# Patient Record
Sex: Male | Born: 1955
Health system: Southern US, Community
[De-identification: ages and names within clinical notes are randomized; demographics above are authoritative.]

## PROBLEM LIST (undated history)

## (undated) DIAGNOSIS — F32A Depression, unspecified: Secondary | ICD-10-CM

## (undated) DIAGNOSIS — F329 Major depressive disorder, single episode, unspecified: Secondary | ICD-10-CM

## (undated) DIAGNOSIS — E119 Type 2 diabetes mellitus without complications: Secondary | ICD-10-CM

## (undated) DIAGNOSIS — B182 Chronic viral hepatitis C: Secondary | ICD-10-CM

## (undated) DIAGNOSIS — E1142 Type 2 diabetes mellitus with diabetic polyneuropathy: Secondary | ICD-10-CM

## (undated) HISTORY — DX: Type 2 diabetes mellitus with diabetic polyneuropathy: E11.42

## (undated) HISTORY — DX: Depression, unspecified: F32.A

## (undated) HISTORY — DX: Major depressive disorder, single episode, unspecified: F32.9

## (undated) HISTORY — DX: Chronic viral hepatitis C: B18.2

---

## 2002-07-06 ENCOUNTER — Emergency Department (HOSPITAL_COMMUNITY): Admission: EM | Admit: 2002-07-06 | Discharge: 2002-07-06 | Payer: Self-pay

## 2005-01-12 ENCOUNTER — Emergency Department (HOSPITAL_COMMUNITY): Admission: EM | Admit: 2005-01-12 | Discharge: 2005-01-13 | Payer: Self-pay | Admitting: Family Medicine

## 2007-01-21 ENCOUNTER — Emergency Department (HOSPITAL_COMMUNITY): Admission: EM | Admit: 2007-01-21 | Discharge: 2007-01-21 | Payer: Self-pay | Admitting: Emergency Medicine

## 2007-05-27 ENCOUNTER — Emergency Department (HOSPITAL_COMMUNITY): Admission: EM | Admit: 2007-05-27 | Discharge: 2007-05-27 | Payer: Self-pay | Admitting: Emergency Medicine

## 2007-05-29 ENCOUNTER — Emergency Department (HOSPITAL_COMMUNITY): Admission: EM | Admit: 2007-05-29 | Discharge: 2007-05-29 | Payer: Self-pay | Admitting: Emergency Medicine

## 2007-07-03 ENCOUNTER — Emergency Department (HOSPITAL_COMMUNITY): Admission: EM | Admit: 2007-07-03 | Discharge: 2007-07-03 | Payer: Self-pay | Admitting: Emergency Medicine

## 2007-08-14 ENCOUNTER — Emergency Department (HOSPITAL_COMMUNITY): Admission: EM | Admit: 2007-08-14 | Discharge: 2007-08-14 | Payer: Self-pay | Admitting: Emergency Medicine

## 2007-08-22 ENCOUNTER — Emergency Department (HOSPITAL_COMMUNITY): Admission: EM | Admit: 2007-08-22 | Discharge: 2007-08-22 | Payer: Self-pay | Admitting: Emergency Medicine

## 2008-01-04 ENCOUNTER — Ambulatory Visit: Payer: Self-pay | Admitting: Internal Medicine

## 2008-01-04 ENCOUNTER — Ambulatory Visit: Payer: Self-pay | Admitting: *Deleted

## 2008-01-04 LAB — CONVERTED CEMR LAB: Microalb, Ur: 0.71 mg/dL (ref 0.00–1.89)

## 2008-01-13 ENCOUNTER — Emergency Department (HOSPITAL_COMMUNITY): Admission: EM | Admit: 2008-01-13 | Discharge: 2008-01-13 | Payer: Self-pay | Admitting: Family Medicine

## 2008-01-20 ENCOUNTER — Ambulatory Visit (HOSPITAL_COMMUNITY): Admission: RE | Admit: 2008-01-20 | Discharge: 2008-01-20 | Payer: Self-pay | Admitting: Internal Medicine

## 2008-01-20 ENCOUNTER — Emergency Department (HOSPITAL_COMMUNITY): Admission: EM | Admit: 2008-01-20 | Discharge: 2008-01-20 | Payer: Self-pay | Admitting: Family Medicine

## 2008-01-30 ENCOUNTER — Ambulatory Visit: Payer: Self-pay | Admitting: Internal Medicine

## 2008-03-06 ENCOUNTER — Ambulatory Visit: Payer: Self-pay | Admitting: *Deleted

## 2008-03-06 ENCOUNTER — Emergency Department (HOSPITAL_COMMUNITY): Admission: EM | Admit: 2008-03-06 | Discharge: 2008-03-07 | Payer: Self-pay | Admitting: Emergency Medicine

## 2008-03-07 ENCOUNTER — Inpatient Hospital Stay (HOSPITAL_COMMUNITY): Admission: RE | Admit: 2008-03-07 | Discharge: 2008-03-14 | Payer: Self-pay | Admitting: *Deleted

## 2008-03-17 ENCOUNTER — Emergency Department (HOSPITAL_COMMUNITY): Admission: EM | Admit: 2008-03-17 | Discharge: 2008-03-17 | Payer: Self-pay | Admitting: Emergency Medicine

## 2008-03-20 ENCOUNTER — Emergency Department (HOSPITAL_COMMUNITY): Admission: EM | Admit: 2008-03-20 | Discharge: 2008-03-20 | Payer: Self-pay | Admitting: Emergency Medicine

## 2008-04-02 ENCOUNTER — Emergency Department (HOSPITAL_COMMUNITY): Admission: EM | Admit: 2008-04-02 | Discharge: 2008-04-02 | Payer: Self-pay | Admitting: Emergency Medicine

## 2008-04-15 ENCOUNTER — Emergency Department (HOSPITAL_COMMUNITY): Admission: EM | Admit: 2008-04-15 | Discharge: 2008-04-15 | Payer: Self-pay | Admitting: Emergency Medicine

## 2008-07-30 ENCOUNTER — Emergency Department (HOSPITAL_COMMUNITY): Admission: EM | Admit: 2008-07-30 | Discharge: 2008-07-30 | Payer: Self-pay | Admitting: Emergency Medicine

## 2009-06-04 ENCOUNTER — Emergency Department (HOSPITAL_COMMUNITY): Admission: EM | Admit: 2009-06-04 | Discharge: 2009-06-05 | Payer: Self-pay | Admitting: Emergency Medicine

## 2009-09-30 IMAGING — CR DG HIP COMPLETE 2+V*R*
3 series · 3 of 3 positions shown · non-contrast
Comparison: 01/20/2008

CLINICAL DATA: Bilateral hip pain right greater left.  Right leg
pain.  The patient fell

RIGHT HIP - COMPLETE 2+ VIEW

[t pelvis a.p.]
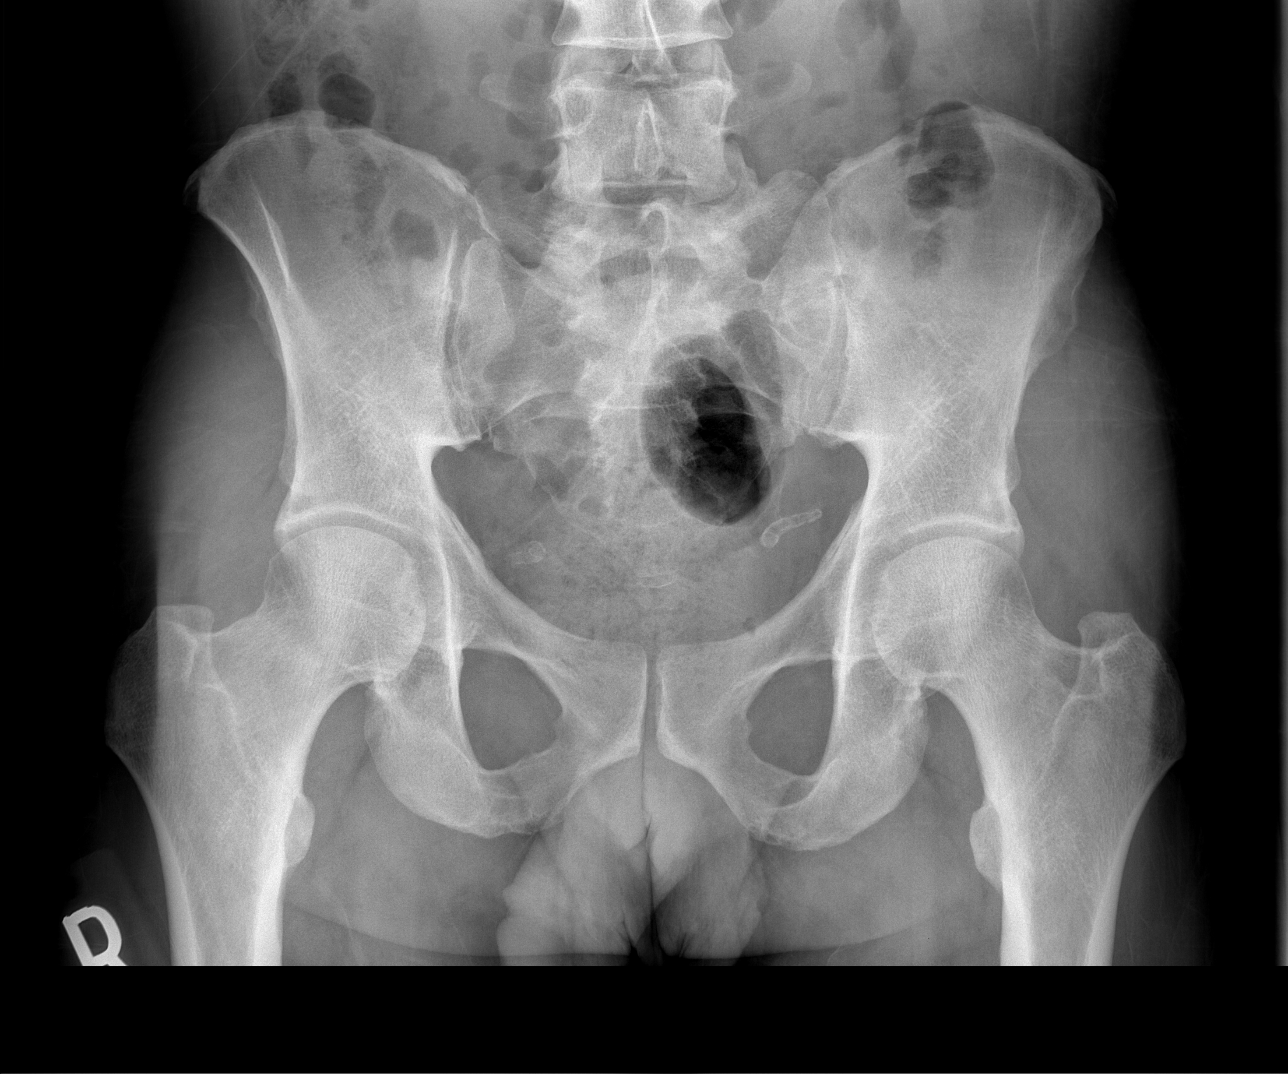

[t hip ap right]
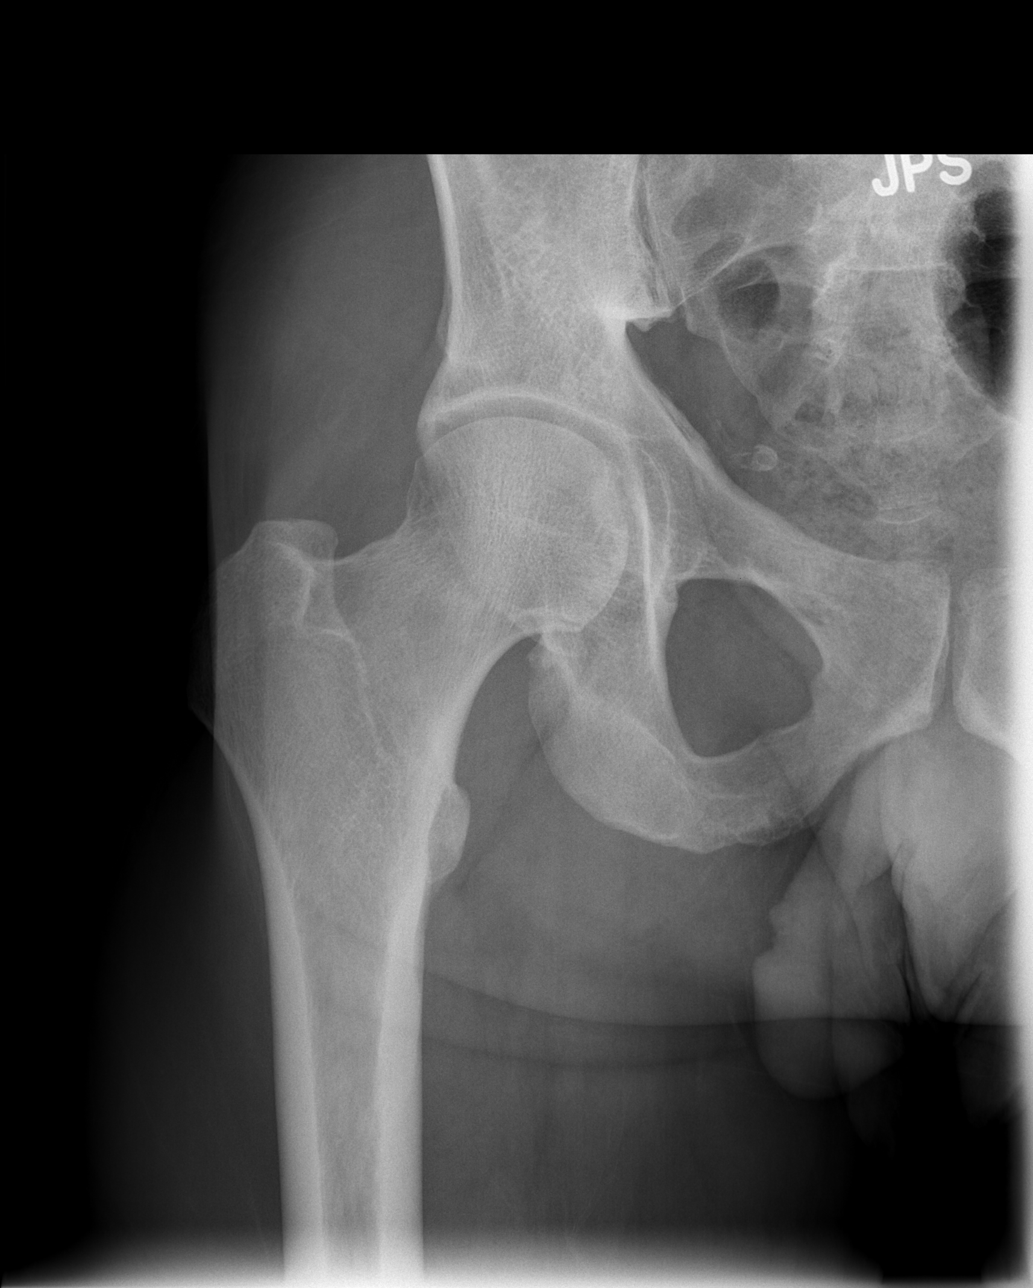

[t hip frog leg right]
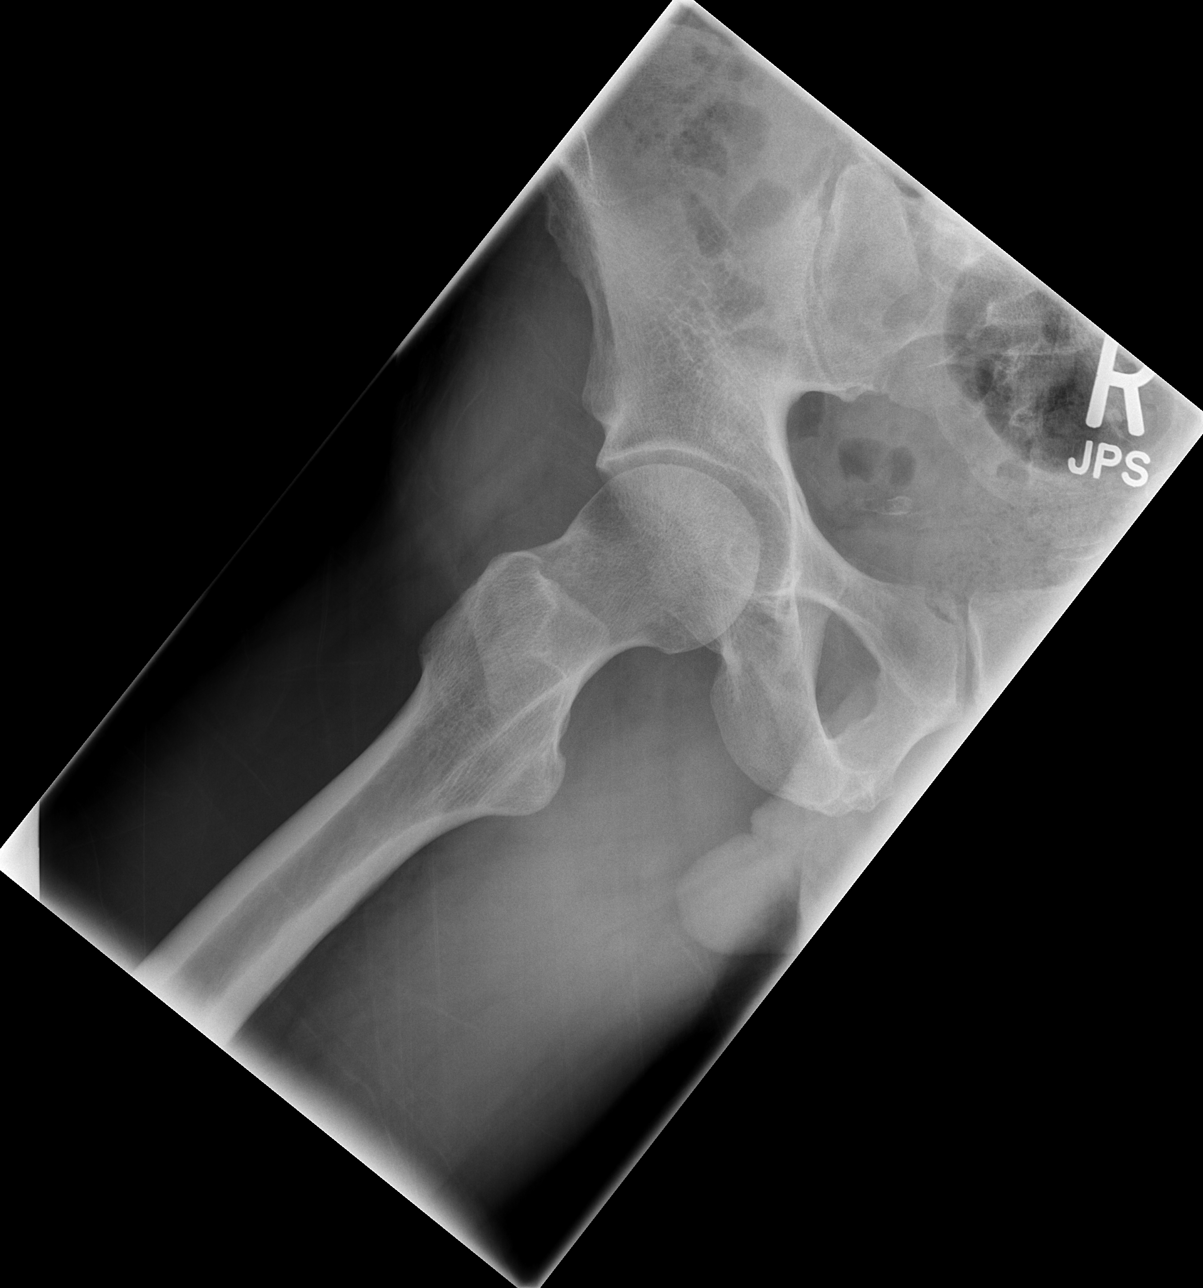

[3 of 3 positions shown; findings below may reference images not displayed]

FINDINGS: Calcified vas deferens compatible with diabetes mellitus.
The hip and sacroiliac articulations appear unremarkable.  No joint
space narrowing.  No osteophytic formation.  No lytic or sclerotic
lesions.  No fracture. Incidental note is made of a transitional
lumbosacral vertebrae with a prominent left transverse process
articulating with the sacral ala.
IMPRESSION: Negative right hip.  Calcified vas deferens consistent with
diabetes mellitus

## 2010-10-20 LAB — URINALYSIS, ROUTINE W REFLEX MICROSCOPIC
Bilirubin Urine: NEGATIVE
Glucose, UA: NEGATIVE mg/dL
Nitrite: NEGATIVE
Protein, ur: NEGATIVE mg/dL
Urobilinogen, UA: 1 mg/dL (ref 0.0–1.0)
pH: 6 (ref 5.0–8.0)

## 2010-10-20 LAB — BASIC METABOLIC PANEL
Creatinine, Ser: 1.06 mg/dL (ref 0.4–1.5)
GFR calc Af Amer: >60 mL/min (ref >60)
GFR calc non Af Amer: >60 mL/min (ref >60)
Glucose, Bld: 143 mg/dL — ABNORMAL HIGH (ref 70–99)
Potassium: 3.8 mEq/L (ref 3.5–5.1)
Sodium: 138 mEq/L (ref 135–145)

## 2010-10-20 LAB — RAPID URINE DRUG SCREEN, HOSP PERFORMED
Amphetamines: NOT DETECTED
Benzodiazepines: NOT DETECTED
Opiates: POSITIVE — AB

## 2010-10-20 LAB — CBC
HCT: 38.8 % — ABNORMAL LOW (ref 39.0–52.0)
MCHC: 34.6 g/dL (ref 30.0–36.0)
MCV: 99.7 fL (ref 78.0–100.0)
RBC: 3.89 MIL/uL — ABNORMAL LOW (ref 4.22–5.81)

## 2010-10-20 LAB — DIFFERENTIAL
Basophils Relative: 1 % (ref 0–1)
Eosinophils Absolute: 0.1 10*3/uL (ref 0.0–0.7)
Monocytes Relative: 9 % (ref 3–12)
Neutro Abs: 2.8 10*3/uL (ref 1.7–7.7)

## 2010-11-01 LAB — RPR: RPR Ser Ql: NONREACTIVE

## 2010-11-30 NOTE — Discharge Summary (Signed)
William Ware, William Ware NO.:  [id number]   MEDICAL RECORD NO.:  [id number]          PATIENT TYPE:  IPS   LOCATION:  0503                          FACILITY:  [facility]   PHYSICIAN:  Geoffery Lyons, M.D.      DATE OF BIRTH:  Mar 21, 1956   DATE OF ADMISSION:  03/07/2008  DATE OF DISCHARGE:  03/14/2008                               DISCHARGE SUMMARY   CHIEF COMPLAINT/HISTORY OF PRESENT ILLNESS:  This was the first  admission to [facility] for this 55 year old African  American male, voluntarily admitted.  Initially presented in the ED  complaining of myalgia and muscle spasms 2 days after he stopped using  heroin.  He reported a history of multiple substances.  Reported a  history of dependency to several substances.  Was in treatment many  years ago in [facility] in [place],  more recently  using heroin for the past year, 2 or 3 bags daily for the past 12  months.  Last used August __________ , 2009.  Drinking about a 6-pack  daily for the past year, cocaine on and off on an irregular basis,  smoking marijuana since age 65.  No clear period of complete abstinence.  One of the stressors is unstable housing.  Although his family is  supportive, he moves from home to home.   PAST PSYCHIATRIC HISTORY:  First time at [facility].   ALCOHOL AND DRUG HISTORY:  As already stated, persistent use of opiates  as of late.  He was in treatment previously at [facility].  No  significant sobriety.   MEDICAL HISTORY:  Aches, pains.  No chronic illnesses.   MEDICATIONS:  None.   PHYSICAL EXAMINATION:  Failed to show any acute findings.   LABORATORY WORKUP:  CBC:  White blood cells 4.3, hemoglobin 13.3, sodium  139, potassium 3.6, BUN 9, creatinine 1.1, glucose 92.  UDS positive for  opiates, cocaine and marijuana.   MENTAL STATUS EXAM:  Reveals a fully alert, cooperative male.  Mood  anxious, depressed.  Affect constricted.  Thought  processes logical,  coherent and relevant.  Not spontaneous.  Denies any active suicidal or  homicidal ideas.  Wants to be substance-free.  No delusions.  No  hallucinations.  Cognition well-preserved.   ADMITTING DIAGNOSES:  AXIS I:  Alcohol, opiate, marijuana, cocaine  abuse.  Rule out dependence.  AXIS II:  No diagnosis.  AXIS III:  No diagnosis.  AXIS IV:  Moderate.  AXIS V:  On admission 35, highest GAF in the last year 60.   COURSE IN THE HOSPITAL:  He was admitted.  He was detoxified with  Librium as well as clonidine.  He was helped with some Robaxin and some  Neurontin.  He was also given some Seroquel.  As already stated, a 55-  year-old male, endorsed a long history of addictions.  Says he cannot  deal with the addiction anymore.  Admits to heroin IV, also cocaine,  marijuana as of recently.  No significant sobriety.  Has lasted 2 days.  When the withdrawal hits him, he gets  out and hustles and gets his fix,  gets high and goes home.  This has been his lifestyle.  Claims he only  has a possession charge but says they do not have any evidence and they  are probably going to throw it out.  Wants to detox, wants to get into a  long-term treatment facility.  Endorsed no income, no real support.  Overwhelmed.  Feeling weak.  Cannot get up.  Aches.  GI distress.  Craving mostly heroin.  He continued to be mostly in bed, having a hard  time with the withdrawal.  Endorsed that he needed more help, wanting to  be considered to go to a residential program.  He was actually pretty  unsteady when walking but did not want to use the wheelchair or the  walker.  On August 25, mostly somatically focused pain, aches, a hard  time anticipating what would happen once he was out of the hospital.  Concerned that he would go back to the same situation as he had on  methadone or Suboxone.  August 26 continued to detox.  Malaise, aches,  pains, anxiety, restlessness, decreased sleep.  We tried  Seroquel 100  every 6 hours as needed.  We continued to address the withdrawal.  August 27 endorsed that the Seroquel was helping with the anxiety, still  worried about where to go.  We continued to work towards identifying  placement options.  He was going to be able to go to [facility] the following  week, so he felt that he could go and stay with his sister and be there  waiting for the bed as he felt that he would be better off at this  particular time being out of the hospital.   DISCHARGE DIAGNOSES:  AXIS I:  Opiate dependence.  Alcohol, marijuana,  cocaine abuse.  Opiate withdrawal, resolved.  Depressive disorder not  otherwise specified.  Anxiety disorder not otherwise specified.  AXIS II:  No diagnosis.  AXIS III:  No diagnosis.  AXIS IV:  Moderate.  AXIS V:  On discharge 50 to 55.   Discharged on Neurontin 100 two 4 times a day and Seroquel 100 one 3  times a day.  Followup by [facility] in [place], [place].      Geoffery Lyons, M.D.  Electronically Signed     IL/MEDQ  D:  03/27/2008  T:  03/28/2008  Job:  [id number]

## 2010-11-30 NOTE — H&P (Signed)
William Ware, TUCKEY               ACCOUNT NO.:  [id number]   MEDICAL RECORD NO.:  [id number]          PATIENT TYPE:  IPS   LOCATION:  0503                          FACILITY:  [facility]   PHYSICIAN:  Geoffery Lyons, M.D.      DATE OF BIRTH:  1955-12-15   DATE OF ADMISSION:  03/07/2008  DATE OF DISCHARGE:                       PSYCHIATRIC ADMISSION ASSESSMENT   TIME:  1340.   IDENTIFICATION:  This is a 55 year old African American male.  This is a  voluntary admission.   HISTORY OF PRESENT ILLNESS:  First [facility] admission for this 55 year old  who initially presented in the emergency room complaining of myalgia and  muscle spasms 2 days after he stopped using heroin.  He reports a  history of polysubstance abuse, with his last treatment many years ago  at the [facility] in [place].  Most recently he has  been using heroin for about the past year, 2 or 3 bags daily for the  past 12 months and last used on March 15, 2008 about $100 worth.  Alcohol since the age of 29, drinking about a six-pack daily for the  past year or so.  Cocaine on and off on a regular basis and smoking  marijuana since age 36, smoking about several joints a day.  His last  drug use was also on March 05, 2008.  No clear period of complete  abstinence.  Says that one of his stressors is unstable housing.  Although his family is supportive, he moves from home to home.  Does not  have much stability.  He denies any history of suicide attempts.  Denies  suicidal thoughts.   PAST PSYCHIATRIC HISTORY:  First [facility] admission.  He does have a history  of substance abuse as noted above with prior treatment at [facility] in [place] many years ago   SOCIAL HISTORY:  A single African American male.  He is unemployed and  is currently homeless.  No known legal charges.   FAMILY HISTORY:  He denies a family history of substance abuse or mental  illness.   MEDICAL HISTORY:  He is followed at  [facility].  In the past has seen  Dr. Reche Dixon for various aches and pains.  He denies any chronic illness.   PAST MEDICAL HISTORY:  Significant for a recent skin rash on his hands  that was treated with Bactroban.   CURRENT MEDICATIONS:  None.   DRUG ALLERGIES:  None.   PHYSICAL EXAM:  Was done in the emergency room as noted in the record.  This is a healthy-appearing African American male.  He is 5 feet 9  inches tall, 155 pounds, temperature 97.5, pulse 63, respirations 18,  blood pressure 154/40.   DIAGNOSTIC STUDIES:  CBC:  WBC 4.3.  Hemoglobin 13.3, hematocrit 39,  platelets 285,000 and MCV 95.4.  Chemistry:  Sodium 139, potassium 3.6,  chloride 105, carbon dioxide is 29, BUN 9, creatinine 1.1 and random  glucose 92.  Alcohol level less than 5.  Urine drug screen was positive  for opiates, cocaine and marijuana.  His urinalysis was  within normal  limits.   MENTAL STATUS EXAM:  A fully alert male, pleasant, cooperative, rather  constricted affect, oriented x4, appropriate, polite.  Speech soft in  tone, normal production.  Mood is neutral.  Thought process logical,  coherent and goal-directed.  Wants to be free from substances; wanting  help with his substance abuse problem.  Open to counseling and for help  with follow-up arrangements.  Insight good.  Impulse control and  judgment within normal limits.  No suicidal thoughts or dangerous ideas.  Immediate recent and remote memory is intact.   DIAGNOSES:  AXIS I:  Alcohol abuse, rule out dependence.  Opiate abuse,  rule out dependence, polysubstance abuse.  AXIS II:  Deferred.  AXIS III:  No diagnosis.  AXIS IV:  Severe issues with homelessness, but having supportive family  is an asset to him.  AXIS V:  Current 46, past year not known.   PLAN:  Admit him with a goal of a safe detox in 5 days.  We started him  on both a clonidine withdrawal protocol to manage his withdrawal from  opiates and a Librium withdrawal protocol  to manage his withdrawal from  alcohol.  He is tolerating them well, is actively participating in our  dual diagnosis program and will follow from there.  He has prescribed  additional medications for comfort and thiamine 100 mg daily along with  multivitamins and trazodone 100 mg at night h.s. p.r.n. insomnia.      Margaret A. Scott, N.P.      Geoffery Lyons, M.D.  Electronically Signed    MAS/MEDQ  D:  03/07/2008  T:  03/07/2008  Job:  [id number]

## 2010-12-02 IMAGING — CR DG CHEST 2V
2 series · 2 of 2 positions shown · non-contrast
Comparison: None

CLINICAL DATA: Medical clearance for alcohol detoxification.

CHEST - 2 VIEW

[w chest pa]
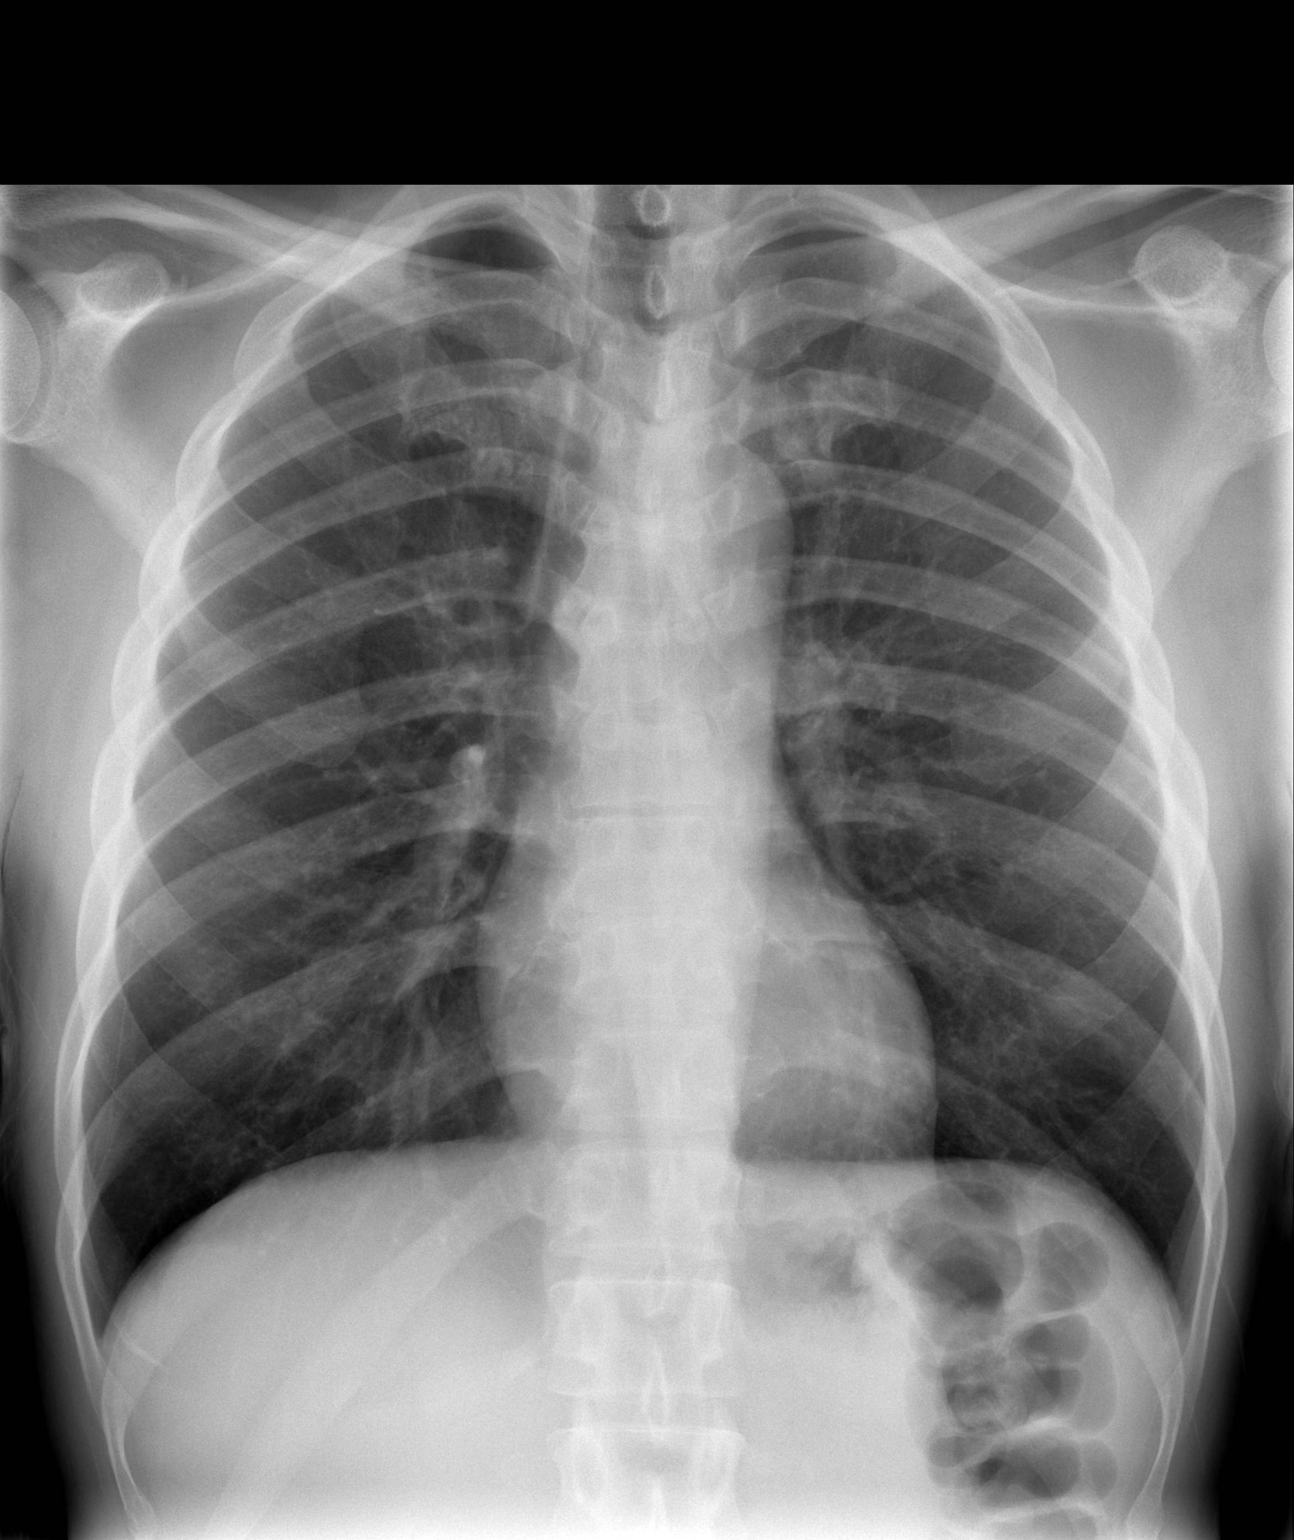

[w chest lat]
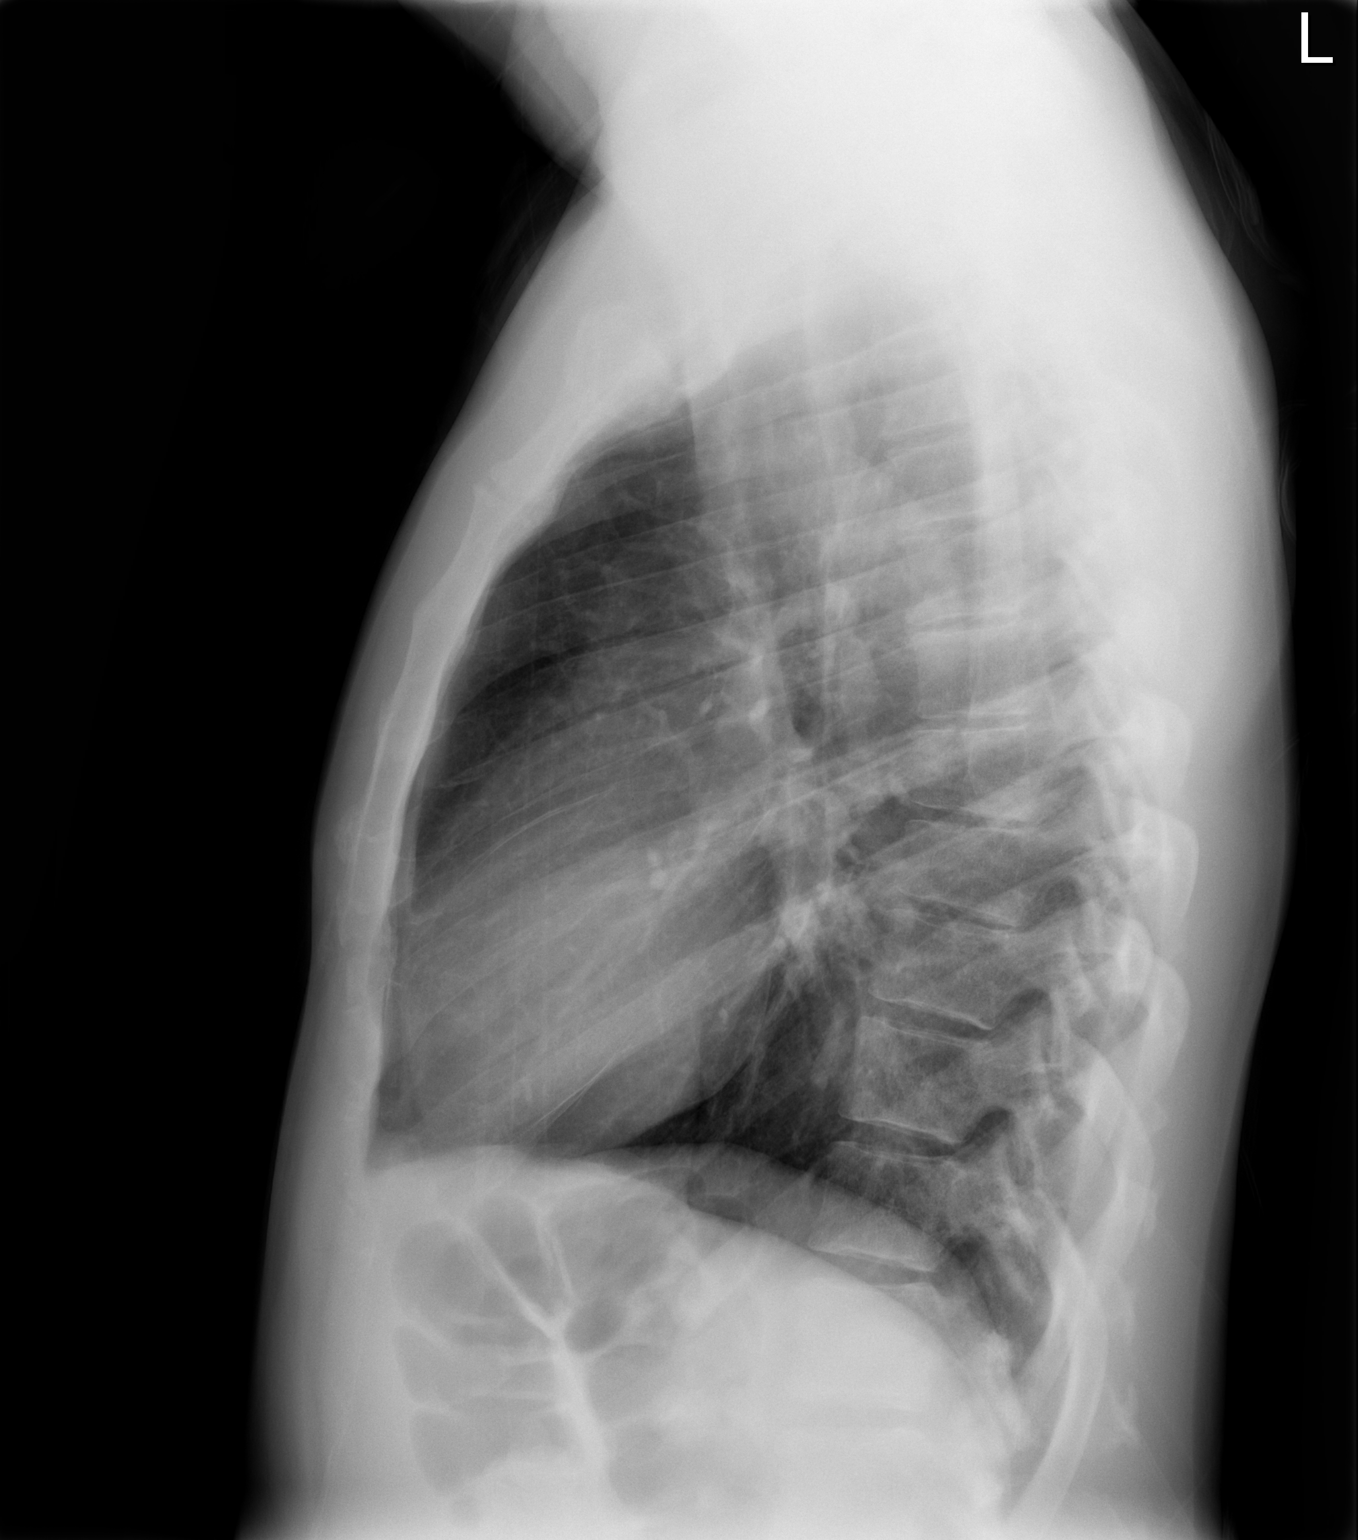

[2 of 2 positions shown; findings below may reference images not displayed]

FINDINGS: The heart size and vascularity are normal and the lungs
are clear.  No significant bony abnormality.
IMPRESSION: Normal chest.

## 2012-07-09 ENCOUNTER — Emergency Department (HOSPITAL_COMMUNITY)
Admission: EM | Admit: 2012-07-09 | Discharge: 2012-07-09 | Disposition: A | Discharge status: Home / Self Care | Payer: Self-pay | Attending: Emergency Medicine | Admitting: Emergency Medicine

## 2012-07-09 ENCOUNTER — Encounter (HOSPITAL_COMMUNITY): Payer: Self-pay | Admitting: Emergency Medicine

## 2012-07-09 DIAGNOSIS — E119 Type 2 diabetes mellitus without complications: Secondary | ICD-10-CM | POA: Insufficient documentation

## 2012-07-09 DIAGNOSIS — M545 Low back pain, unspecified: Secondary | ICD-10-CM | POA: Insufficient documentation

## 2012-07-09 DIAGNOSIS — IMO0001 Reserved for inherently not codable concepts without codable children: Secondary | ICD-10-CM | POA: Insufficient documentation

## 2012-07-09 DIAGNOSIS — M5430 Sciatica, unspecified side: Secondary | ICD-10-CM | POA: Insufficient documentation

## 2012-07-09 HISTORY — DX: Type 2 diabetes mellitus without complications: E11.9

## 2012-07-09 MED ORDER — OXYCODONE-ACETAMINOPHEN 5-325 MG PO TABS: 2.0000 | ORAL_TABLET | ORAL | Status: DC | PRN

## 2012-07-09 MED ORDER — OXYCODONE-ACETAMINOPHEN 5-325 MG PO TABS
2.0000 | ORAL_TABLET | Freq: Once | ORAL | Status: AC
  Administered 2012-07-09: 2 via ORAL
  Filled 2012-07-09: qty 2

## 2012-07-09 NOTE — ED Notes (Signed)
PA at bedside.

## 2012-07-09 NOTE — ED Provider Notes (Signed)
Medical screening examination/treatment/procedure(s) were performed by non-physician practitioner and as supervising physician I was immediately available for consultation/collaboration.   William Ware. Tighe Gitto, MD 07/09/12 1201

## 2012-07-09 NOTE — ED Notes (Signed)
Patient complaining of sciatica pain in his left leg.  Patient reports that he was diagnosed several months ago and he is "having another flare-up". Pain began yesterday; progressively gotten worse over the course of the night.  Describes pain as "sharp" and "shooting"; rates pain 10/10 on the numerical pain scale.  Patient ambulatory from triage to room.

## 2012-07-09 NOTE — ED Provider Notes (Signed)
History     CSN: [id number]  Arrival date & time 07/09/12  1191   First MD Initiated Contact with Patient 07/09/12 [phone]      Chief Complaint  Patient presents with  . Sciatica    (Consider location/radiation/quality/duration/timing/severity/associated sxs/prior treatment) HPI Comments: Patient is a 56 year old male with a past medical history of sciatica and diabetes who presents with a 1 day history of back pain. Patient reports gradual onset and progressive worsening since onset. The pain is located in his left lower back and radiates down his left leg. The pain is sharp and shooting and severe. The pain is constant without alleviating/aggravating factors. Patient reports occasional "flare-ups" of his sciatica which feel like this. Pain is usually relieved with Percocet. Patient has had toradol in the past which does not help. No associated symptoms.    Past Medical History  Diagnosis Date  . Sciatica   . Diabetes mellitus without complication     History reviewed. No pertinent past surgical history.  History reviewed. No pertinent family history.  History  Substance Use Topics  . Smoking status: Never Smoker   . Smokeless tobacco: Not on file  . Alcohol Use: No      Review of Systems  Musculoskeletal: Positive for myalgias and back pain.  All other systems reviewed and are negative.    Allergies  Review of patient's allergies indicates no known allergies.  Home Medications   Current Outpatient Rx  Name  Route  Sig  Dispense  Refill  . METFORMIN HCL 500 MG PO TABS   Oral   Take 500 mg by mouth 2 (two) times daily with a meal.         . OXYCODONE-ACETAMINOPHEN 5-325 MG PO TABS   Oral   Take 1 tablet by mouth every 4 (four) hours as needed. For pain         . OXYCODONE-ACETAMINOPHEN 5-325 MG PO TABS   Oral   Take 2 tablets by mouth every 4 (four) hours as needed for pain.   12 tablet   0     BP 136/68  Pulse 65  Temp 97.9 F (36.6 C) (Oral)   Resp 18  SpO2 100%  Physical Exam  Nursing note and vitals reviewed. Constitutional: He is oriented to person, place, and time. He appears well-developed and well-nourished. No distress.  HENT:  Head: Normocephalic and atraumatic.  Eyes: Conjunctivae normal and EOM are normal.  Neck: Normal range of motion. Neck supple.  Cardiovascular: Normal rate, regular rhythm and intact distal pulses.  Exam reveals no gallop and no friction rub.   No murmur heard. Pulmonary/Chest: Effort normal and breath sounds normal. He has no wheezes. He has no rales. He exhibits no tenderness.  Abdominal: Soft. There is no tenderness.  Musculoskeletal: Normal range of motion.  Neurological: He is alert and oriented to person, place, and time. Coordination normal.       Strength and sensation equal and intact bilaterally. Speech is goal-oriented. Moves limbs without ataxia.   Skin: Skin is warm and dry. He is not diaphoretic.  Psychiatric: He has a normal mood and affect. His behavior is normal.    ED Course  Procedures (including critical care time)  Labs Reviewed - No data to display No results found.   1. Sciatica       MDM  6:20 AM Patient will have percocet here and a prescription for percocet to go home with. No neurovascular compromise or injury. No imaging  needed. No further evaluation needed at this time. Patient instructed to return with worsening or concerning symptoms.         Emilia Beck, PA-C 07/09/12 0630

## 2012-07-09 NOTE — ED Notes (Signed)
Patient given copy of discharge paperwork; went over discharge instructions with patient.  Patient instructed to take Percocet as directed, to not drive/drink while taking Percocet, to follow up with primary care physician, and to return to the ED for new, worsening, or concerning symptoms.

## 2012-07-09 NOTE — ED Notes (Signed)
Patient updated on plan of care; informed patient that we are currently waiting on discharge instructions.

## 2013-11-21 ENCOUNTER — Encounter (HOSPITAL_COMMUNITY): Payer: Self-pay | Admitting: Emergency Medicine

## 2013-11-21 DIAGNOSIS — F17200 Nicotine dependence, unspecified, uncomplicated: Secondary | ICD-10-CM | POA: Insufficient documentation

## 2013-11-21 DIAGNOSIS — E119 Type 2 diabetes mellitus without complications: Secondary | ICD-10-CM | POA: Insufficient documentation

## 2013-11-21 DIAGNOSIS — Z8739 Personal history of other diseases of the musculoskeletal system and connective tissue: Secondary | ICD-10-CM | POA: Insufficient documentation

## 2013-11-21 DIAGNOSIS — F1110 Opioid abuse, uncomplicated: Secondary | ICD-10-CM | POA: Insufficient documentation

## 2013-11-21 DIAGNOSIS — F19939 Other psychoactive substance use, unspecified with withdrawal, unspecified: Principal | ICD-10-CM | POA: Insufficient documentation

## 2013-11-21 LAB — COMPREHENSIVE METABOLIC PANEL
ALBUMIN: 3.5 g/dL (ref 3.5–5.2)
ALT: 21 U/L (ref 0–53)
AST: 37 U/L (ref 0–37)
Alkaline Phosphatase: 70 U/L (ref 39–117)
BILIRUBIN TOTAL: 0.6 mg/dL (ref 0.3–1.2)
BUN: 8 mg/dL (ref 6–23)
CHLORIDE: 93 meq/L — AB (ref 96–112)
CO2: 24 mEq/L (ref 19–32)
Calcium: 9.8 mg/dL (ref 8.4–10.5)
Creatinine, Ser: 0.69 mg/dL (ref 0.50–1.35)
GFR calc Af Amer: >90 mL/min (ref >90)
GFR calc non Af Amer: >90 mL/min (ref >90)
Glucose, Bld: 264 mg/dL — ABNORMAL HIGH (ref 70–99)
POTASSIUM: 4 meq/L (ref 3.7–5.3)
Sodium: 133 mEq/L — ABNORMAL LOW (ref 137–147)
Total Protein: 8.9 g/dL — ABNORMAL HIGH (ref 6.0–8.3)

## 2013-11-21 LAB — CBC
HCT: 41.3 % (ref 39.0–52.0)
Hemoglobin: 14.8 g/dL (ref 13.0–17.0)
MCH: 31 pg (ref 26.0–34.0)
MCHC: 35.8 g/dL (ref 30.0–36.0)
MCV: 86.6 fL (ref 78.0–100.0)
PLATELETS: 358 10*3/uL (ref 150–400)
RBC: 4.77 MIL/uL (ref 4.22–5.81)
RDW: 11.8 % (ref 11.5–15.5)
WBC: 4.3 10*3/uL (ref 4.0–10.5)

## 2013-11-21 LAB — SALICYLATE LEVEL

## 2013-11-21 LAB — ETHANOL: Alcohol, Ethyl (B): <11 mg/dL (ref 0–11)

## 2013-11-21 LAB — ACETAMINOPHEN LEVEL

## 2013-11-21 NOTE — ED Notes (Addendum)
Pt states he has been off heroin for a day and is feeling bad. Pt states that without the heroin he is having generalized body aches and pains all over. Pt states he also used cocaine yesterday. Pt denies SI or HI.

## 2013-11-22 ENCOUNTER — Encounter (HOSPITAL_COMMUNITY): Payer: Self-pay | Admitting: Emergency Medicine

## 2013-11-22 ENCOUNTER — Emergency Department (HOSPITAL_COMMUNITY)
Admission: EM | Admit: 2013-11-22 | Discharge: 2013-11-22 | Disposition: A | Discharge status: Other Acute Inpatient Hospital | Payer: Self-pay | Attending: Emergency Medicine | Admitting: Emergency Medicine

## 2013-11-22 DIAGNOSIS — F1123 Opioid dependence with withdrawal: Secondary | ICD-10-CM

## 2013-11-22 DIAGNOSIS — F1193 Opioid use, unspecified with withdrawal: Secondary | ICD-10-CM

## 2013-11-22 LAB — RAPID URINE DRUG SCREEN, HOSP PERFORMED
Amphetamines: NOT DETECTED
BARBITURATES: NOT DETECTED
BENZODIAZEPINES: NOT DETECTED
Cocaine: POSITIVE — AB
Opiates: POSITIVE — AB
Tetrahydrocannabinol: NOT DETECTED

## 2013-11-22 LAB — CBG MONITORING, ED: Glucose-Capillary: 277 mg/dL — ABNORMAL HIGH (ref 70–99)

## 2013-11-22 MED ORDER — METHOCARBAMOL 500 MG PO TABS
500.0000 mg | ORAL_TABLET | Freq: Three times a day (TID) | ORAL | Status: DC | PRN
  Administered 2013-11-22: 500 mg via ORAL
  Filled 2013-11-22: qty 1

## 2013-11-22 MED ORDER — ONDANSETRON 4 MG PO TBDP
4.0000 mg | ORAL_TABLET | Freq: Four times a day (QID) | ORAL | Status: DC | PRN
  Administered 2013-11-22: 4 mg via ORAL
  Filled 2013-11-22: qty 1

## 2013-11-22 MED ORDER — CLONIDINE HCL 0.1 MG PO TABS: 0.1000 mg | ORAL_TABLET | Freq: Every day | ORAL | Status: DC

## 2013-11-22 MED ORDER — HYDROXYZINE HCL 25 MG PO TABS
25.0000 mg | ORAL_TABLET | Freq: Four times a day (QID) | ORAL | Status: DC | PRN
  Administered 2013-11-22 (×2): 25 mg via ORAL
  Filled 2013-11-22 (×2): qty 1

## 2013-11-22 MED ORDER — CLONIDINE HCL 0.1 MG PO TABS
0.1000 mg | ORAL_TABLET | Freq: Four times a day (QID) | ORAL | Status: DC
Start: 2013-11-22 — End: 2013-11-23
  Administered 2013-11-22 (×4): 0.1 mg via ORAL
  Filled 2013-11-22 (×4): qty 1

## 2013-11-22 MED ORDER — DICYCLOMINE HCL 20 MG PO TABS
20.0000 mg | ORAL_TABLET | Freq: Four times a day (QID) | ORAL | Status: DC | PRN
  Administered 2013-11-22 (×2): 20 mg via ORAL
  Filled 2013-11-22 (×2): qty 1

## 2013-11-22 MED ORDER — METFORMIN HCL 500 MG PO TABS: 500.0000 mg | ORAL_TABLET | Freq: Two times a day (BID) | ORAL | Status: DC

## 2013-11-22 MED ORDER — CLONIDINE HCL 0.1 MG PO TABS: 0.1000 mg | ORAL_TABLET | Freq: Two times a day (BID) | ORAL | Status: DC | PRN

## 2013-11-22 MED ORDER — IBUPROFEN 800 MG PO TABS
800.0000 mg | ORAL_TABLET | Freq: Once | ORAL | Status: AC
  Administered 2013-11-22: 800 mg via ORAL
  Filled 2013-11-22: qty 1

## 2013-11-22 MED ORDER — SODIUM CHLORIDE 0.9 % IV SOLN
Freq: Once | INTRAVENOUS | Status: AC
  Administered 2013-11-22: 04:00:00 via INTRAVENOUS

## 2013-11-22 MED ORDER — NAPROXEN 250 MG PO TABS: 500.0000 mg | ORAL_TABLET | Freq: Two times a day (BID) | ORAL | Status: DC | PRN

## 2013-11-22 MED ORDER — METFORMIN HCL 500 MG PO TABS
1000.0000 mg | ORAL_TABLET | Freq: Once | ORAL | Status: AC
  Administered 2013-11-22: 1000 mg via ORAL
  Filled 2013-11-22: qty 2

## 2013-11-22 MED ORDER — CLONIDINE HCL 0.1 MG PO TABS: 0.1000 mg | ORAL_TABLET | ORAL | Status: DC

## 2013-11-22 MED ORDER — ONDANSETRON 4 MG PO TBDP: 4.0000 mg | ORAL_TABLET | Freq: Three times a day (TID) | ORAL | Status: DC | PRN

## 2013-11-22 MED ORDER — LOPERAMIDE HCL 2 MG PO CAPS
2.0000 mg | ORAL_CAPSULE | ORAL | Status: DC | PRN
  Administered 2013-11-22: 2 mg via ORAL
  Filled 2013-11-22: qty 1

## 2013-11-22 MED ORDER — CLONIDINE HCL 0.2 MG PO TABS: 0.1000 mg | ORAL_TABLET | Freq: Two times a day (BID) | ORAL | Status: DC

## 2013-11-22 NOTE — ED Provider Notes (Signed)
CSN: [id number]     Arrival date & time 11/21/13  2216 History   First MD Initiated Contact with Patient 11/22/13 0222     Chief Complaint  Patient presents with  . Drug Problem    detox herion     (Consider location/radiation/quality/duration/timing/severity/associated sxs/prior Treatment) HPI History provided by patient. Has been abusing heroin for the last 9 months regularly and now is trying to stop. He last used heroin yesterday. He is now complaining of body aches all over, nausea and diarrhea. No fevers or chills. He is asking for help with detox. He has some depression but denies any suicidal or homicidal ideations. Past Medical History  Diagnosis Date  . Sciatica   . Diabetes mellitus without complication    History reviewed. No pertinent past surgical history. History reviewed. No pertinent family history. History  Substance Use Topics  . Smoking status: Current Every Day Smoker  . Smokeless tobacco: Not on file  . Alcohol Use: Yes    Review of Systems  Constitutional: Negative for fever and chills.  Eyes: Negative for visual disturbance.  Respiratory: Negative for shortness of breath.   Cardiovascular: Negative for chest pain.  Gastrointestinal: Negative for vomiting and abdominal pain.  Genitourinary: Negative for flank pain.  Musculoskeletal: Negative for back pain.  Skin: Negative for rash.  Neurological: Negative for seizures and syncope.  All other systems reviewed and are negative.     Allergies  Review of patient's allergies indicates no known allergies.  Home Medications   Prior to Admission medications   Not on File   BP 149/77  Pulse 74  Temp(Src) 99.3 F (37.4 C) (Oral)  Resp 16  Ht 5\' 9"  (1.753 m)  Wt 165 lb (74.844 kg)  BMI 24.36 kg/m2  SpO2 100% Physical Exam  Constitutional: He is oriented to person, place, and time. He appears well-developed and well-nourished.  HENT:  Head: Normocephalic and atraumatic.  Mouth/Throat:  Oropharynx is clear and moist.  Eyes: EOM are normal. Pupils are equal, round, and reactive to light.  Neck: Neck supple.  Cardiovascular: Normal rate, regular rhythm and intact distal pulses.   Pulmonary/Chest: Effort normal and breath sounds normal. No respiratory distress.  Abdominal: Soft. He exhibits no distension. There is no tenderness.  Musculoskeletal: Normal range of motion. He exhibits no edema and no tenderness.  Neurological: He is alert and oriented to person, place, and time.  Skin: Skin is warm and dry.    ED Course  Procedures (including critical care time) Labs Review Labs Reviewed  COMPREHENSIVE METABOLIC PANEL - Abnormal; Notable for the following:    Sodium 133 (*)    Chloride 93 (*)    Glucose, Bld 264 (*)    Total Protein 8.9 (*)    All other components within normal limits  SALICYLATE LEVEL - Abnormal; Notable for the following:    Salicylate Lvl <2.0 (*)    All other components within normal limits  ACETAMINOPHEN LEVEL  CBC  ETHANOL  URINE RAPID DRUG SCREEN (HOSP PERFORMED)   IV fluids. Zofran and clonidine provided.  Plan discharge with prescription for clonidine as needed and outpatient resources. Return precautions provided and verbalizes understood.  MDM  \ Diagnosis: Heroin abuse with narcotic withdrawal  Evaluated with labs and urinalysis reviewed as above. Medications provided. Vital signs and nursing notes reviewed and considered. Stable and appropriate for outpatient management.  Sunnie NielsenBrian Ruben Mahler, MD 11/22/13 [phone]

## 2013-11-22 NOTE — ED Notes (Signed)
Pt called from waiting room with no answer 

## 2013-11-22 NOTE — ED Provider Notes (Signed)
Pt agrees to [facility] transfer. 1745  William HornJohn M Ketra Duchesne, MD 11/27/13 2215

## 2013-11-22 NOTE — Discharge Instructions (Signed)
Narcotic Withdrawal  °When drug use interferes with normal living activities and relationships, it is abuse. Abuse includes problems with family and friends. Psychological dependence has developed when your mind tells you that the drug is needed. This is usually followed by a physical dependence in which you need more of the drug to get the same feeling or "high." This is known as addiction or chemical dependency. Risk is greater when chemical dependency exists in the family.  °SYMPTOMS  °When tolerance to narcotics has developed, stopping of the narcotic suddenly can cause uncomfortable physical symptoms. Most of the time these are mild and consist of shakes or jitters (tremors) in the hands,a rapid heart rate, rapid breathing, and temperature. Sometimes these symptoms are associated with anxiety, panic attacks, and bad dreams. Other symptoms include:  °Irritability.  °Anxiety.  °Runny nose.  °"Goose flesh."  °Diarrhea.  °Feeling sick to the stomach (nauseous).  °Muscle spasms.  °Sleeplessness.  °Chills.  °Sweats.  °Drug cravings.  °Confusion. °The severity of the withdrawal is based on the individual and varies from person to person. Many people choose to continue using narcotics to get rid of the discomfort of withdrawal. They also use to try to feel normal.  °TREATMENT  °Quitting an addiction means stopping use of all chemicals. This is hard but may save your life. With continual drug use, possible outcomes are often loss of self respect and esteem, violence, death, and eventually prison if the use of narcotics has led to the death of another.  °Addiction cannot be cured, but it can be stopped. This often requires outside help and the care of professionals. Most hospitals and clinics can refer you to a specialized care center.  °It is not necessary for you to go through the uncomfortable symptoms of withdrawal. Your caregiver can provide you with medications that will help you through this difficult period. Try  to avoid situations, friends, or alcohol, which may have made it possible for you to continue using narcotics in the past. Learn how to say no!  °HOME CARE INSTRUCTIONS  °Drink fluids, get plenty of rest, and take hot baths.  °Medicines may be prescribed to help control withdrawal symptoms.  °Over-the-counter medicines may be helpful to control diarrhea or an upset stomach.  °If your problems resulted from taking prescription pain medicines, make sure you have a follow-up visit with your caregiver within the next few days. Be open about this problem.  °If you are dependent or addicted to street drugs, contact a local drug and alcohol treatment center or Narcotics Anonymous.  °Have someone with you to monitor your symptoms.  °Engage in healthy activities with friends who do not use drugs.  °Stay away from the drug scene. °It takes a long period of time to overcome addictions to all drugs. There may be times when you feel as though you want to use. Following loss of a physical addiction and going through withdrawal, you have conquered the most difficult part of getting rid of an addiction. Gradually, you will have a lessening of the craving that is telling you that you need narcotics to feel normal. Call your caregiver or a member of your support group if more support is needed. Learn who to talk to in your family and among your friends so that during these periods you can receive outside help.  °SEEK IMMEDIATE MEDICAL CARE IF:  °You have vomiting that cannot be controlled, especially if you cannot keep liquids down.  °You are seeing things or hearing   voices that are not really there (hallucinating).  °You have a seizure. °Document Released: 09/24/2002 Document Revised: 09/26/2011 Document Reviewed: 06/29/2008  °ExitCare® Patient Information ©2014 [employer].  ° ° °Emergency Department Resource Guide °1) Find a Doctor and Pay Out of Pocket °Although you won't have to find out who is covered by your insurance plan,  it is a good idea to ask around and get recommendations. You will then need to call the office and see if the doctor you have chosen will accept you as a new patient and what types of options they offer for patients who are self-pay. Some doctors offer discounts or will set up payment plans for their patients who do not have insurance, but you will need to ask so you aren't surprised when you get to your appointment. ° °2) Contact Your Local Health Department °Not all health departments have doctors that can see patients for sick visits, but many do, so it is worth a call to see if yours does. If you don't know where your local health department is, you can check in your phone book. The CDC also has a tool to help you locate your state's health department, and many state websites also have listings of all of their local health departments. ° °3) Find a Walk-in Clinic °If your illness is not likely to be very severe or complicated, you may want to try a walk in clinic. These are popping up all over the country in pharmacies, drugstores, and shopping centers. They're usually staffed by nurse practitioners or physician assistants that have been trained to treat common illnesses and complaints. They're usually fairly quick and inexpensive. However, if you have serious medical issues or chronic medical problems, these are probably not your best option. ° °No Primary Care Doctor: °- Call [facility] at  832-8000 - they can help you locate a primary care doctor that  accepts your insurance, provides certain services, etc. °- Physician Referral Service- 1-800-533-3463 ° °Chronic Pain Problems: °Organization         Address  Phone   Notes  °[facility]  (336) 297-2271 Patients need to be referred by their primary care doctor.  ° °Medication Assistance: °Organization         Address  Phone   Notes  °[place] [facility] [address] °[place], [place] [address] (336)  641-8030 --Must be a resident of [place] °-- Must have NO insurance coverage whatsoever (no Medicaid/ Medicare, etc.) °-- The pt. MUST have a primary care doctor that directs their care regularly and follows them in the community °  °[facility]  (866) 331-1348   °[employer]  (888) 892-1162   ° °Agencies that provide inexpensive medical care: °Organization         Address  Phone   Notes  °[facility]  (336) 832-8035   °[facility]    (336) 832-7272   °[facility] [address] °[place], [place] [address] (336) 832-4777   °[facility] [address], °[place] (336) 271-4999   °[facility]    (336) 373-0678   °[facility]    (336) 272-1050   °[facility] ° [address], [place] Phone:  (336) 832-4444, Fax:  (336) 832-4440 Hours of Operation:  9 am - 6 pm, M-F.  Also accepts Medicaid/Medicare and self-pay.  °[facility] ° [address], [place] Phone: (336) 832-3150, Fax: (336) 832-3151. Hours of Operation:  8:30 am - 5:30 pm, M-F.  Also accepts Medicaid and self-pay.  °[facility] [place] [address], [place] Phone: (336) 878-6027   °[facility] [address], [place], [place] (336)723-1848, Ext. 123 Mondays & Thursdays: 7-9 AM.  First 15 patients are seen on a first come, first serve basis. °  ° °Medicaid-accepting [place] Providers: ° °Organization         Address  Phone   Notes  °[facility] [address], [place] (336) 641-2100 Also accepts self-pay patients.  °[facility] [address], [place] ° (336) 856-9996   °[facility] [address], [place] (336) 288-8857   °[facility] [address] [place] [address], [place] (336) 299-7000   °Veita Bland [address], [place]  ° (336)  373-1557 Only accepts [place] Access Medicaid patients after they have their name applied to their card.  ° °Self-Pay (no insurance) in [place]: ° °Organization         Address  Phone   Notes  °Sickle Cell Patients, [facility] [address], [place] (336) 832-1970   °[facility] [address], [place] (336) 832-4400   °[facility] ° [address],  (336) 992-4800   °[facility]/Dr. Osei-Bonsu ° [address] [place] [address], [place] or [address], [place] (336) 841-8500 Phone number for both [place] and [place] locations is the same.  °[facility] [address], [place] (336) 299-0000   °[facility] [address] [place] [address], [place] or [address] (336) 852-7530 °(336) 878-2260   °[facility] [address], [place] (336) 350-1642, phone; (336) 294-5005, fax Sees patients 1st and 3rd Saturday of every month.  Must not qualify for public or private insurance (i.e. Medicaid, Medicare, [place] Health Choice, Veterans' Benefits) • Household income should be no more than 200% of the poverty level •The clinic cannot treat you if you are pregnant or think you are pregnant • Sexually transmitted diseases are not treated at the clinic.  ° ° °Dental Care: °Organization         Address  Phone  Notes  °[facility] [address], [place] (336) 641-6152 Accepts children up to age 21 who are enrolled in Medicaid or [place] Health Choice; pregnant women with a Medicaid card; and children who have applied for Medicaid or [place] Health Choice, but were declined, whose parents can pay a reduced fee at time of service.  °[facility] [place]  [address], [place] (336) 641-7733 Accepts children up to age 21 who are enrolled in Medicaid or [place] Health  Choice; pregnant women with a Medicaid card; and children who have applied for Medicaid or [place] Health Choice, but were declined, whose parents can pay a reduced fee at time of service.  °[facility] ° [address],  (336) 641-4533 Patients are seen by appointment only. Walk-ins are not accepted. [facility] will see patients 18 years of age and older. °Monday - Tuesday (8am-5pm) °Most Wednesdays (8:30-5pm) °$30 per visit, cash only  °[facility] ° [address], [place] (336) 641-4533 Patients are seen by appointment only. Walk-ins are not accepted. [facility] will see patients 18 years of age and older. °One Wednesday Evening (Monthly: Volunteer Based).  $30 per visit, cash only  °[facility]  (919) 537-3737 for adults; Children under age 4, call Graduate Pediatric Dentistry at ([phone]) [phone]. Children aged 4-14, please call (919) [phone] to request a pediatric application. ° Dental services are provided in all areas of dental care including fillings, crowns and bridges, complete and partial dentures, implants, gum treatment, root canals, and extractions. Preventive care is also provided. Treatment is provided to both adults and children. °Patients are selected via a lottery and there is often a waiting list. °  °[facility] [address], °[place] ° (336) 763-8833 [email or web address] °  °[facility] [address], [place], [place] (336)723-1848, Ext. 123 Second and Fourth Thursday of each month, opens at 6:30 AM; Clinic ends at 9 AM.  Patients are seen on a first-come first-served basis, and a limited number are seen during each clinic.  ° °[facility] ° [address], [place], [place] (336) 723-7904   Eligibility Requirements °You must have lived in [place], [place], or [place] counties for at least the last three months. °  You cannot be eligible for state or  federal sponsored healthcare insurance, including Veterans Administration, Medicaid, or Medicare. °  You generally cannot be eligible for healthcare insurance through your employer.  °  How to apply: °Eligibility screenings are held every Tuesday and Wednesday afternoon from 1:00 pm until 4:00 pm. You do not need an appointment for the interview!  °[facility] [address], [place], [place] 336-631-2330   °[facility] [facility]  336-342-8273   °[place]  336-703-3100   °[facility]  336-570-6415   ° °Behavioral Health Resources in the Community: °Intensive Outpatient Programs °Organization         Address  Phone  Notes  °[facility] [address], [place], [place] 336-878-6098   °[facility] [address], [place], [place] 336-832-9800   °[facility] [address], [place], [place] ° 336-882-2125   °[facility] [address],  °[place], [place] 1-800-853-5163 or 336-641-4981   °Substance Abuse Resources °Organization         Address  Phone  Notes  °[facility]  336-882-2125   °[facility]  336-784-9470   °[facility]  336-285-9073   °[facility]  336-845-3988   °Residential & Outpatient Substance Abuse Program  1-800-659-3381   °Psychological Services °Organization         Address  Phone  Notes  °[facility]  336- 832-9600   °[facility]  336- 378-7881   °[facility] [address], [place] 1-800-853-5163 or 336-641-4981   ° °Mobile Crisis Teams °Organization         Address  Phone  Notes  °[facility], Mobile Crisis Care Unit  1-877-626-1772   °Assertive °[facility] ° [address] [place], [place] 336-834-9664   °Sharon DeEsch [address] °[place]  336-554-5454   ° °Self-Help/Support Groups °Organization         Address  Phone              Notes  °[facility] [place] - variety of support groups  336- 373-1402 Call for more information  °Narcotics Anonymous (NA), [facility] [address], °[place]  2 meetings at this location  ° °Residential Treatment Programs °Organization         Address  Phone  Notes  °[facility] [address],    °[place]  1-866-801-8205   °[facility] ° [address], [place], [place] 704-293-8524   °[facility] [address], [place] 336-845-3988 Admissions: 8am-3pm M-F  °[facility] [address]    °[place], [place] 336-841-1104   °[facility] [address], [place], [place] 336-379-7146   °[facility] [address]  °[place], [place] 336-285-9073   °[facility] Intensive Outpatient [address], [place], [place] 336-852-3033   °[facility] [address]  °[place], [place] 1-877-615-2722 or 336-784-9470   °[facility] ([facility]) [address] [place], [place] 336-227-7417 Accepts Medicaid  °[facility] [address]  °[place] 1-800-659-3381 Substance Abuse/Addiction Treatment  ° °[place] Behavioral Health Resources °Organization         Address  Phone  Notes  °[facility]  (888) 581-9988   °Julie Brannon, PhD [address] [place], [place]   (336) 349-5553 or (336) 951-0000   °[facility]   [address] °[place], [place] (336) 349-4454   °[facility] [address], [place], [place] (336) 342-8316 Insurance/Medicaid/sponsorship through [facility]  °[facility] [address]                                    [place], [place] (336) 342-8316 Therapy/tele-psych/case  °[facility] [address]  ° [place], [place] (336) 349-2233    °Dr. Arfeen  (336) 349-4544   °[facility] [place]  [employer] [place] [facility]pt[facility] 1) [address], [place] °2) [address], [place] °3)  [address], [place] (336) 349-3220 °(336) 342-7768 ° °(336) 342-8140   °[place] Child Abuse [facility] (336) 342-1394 or (336) 342-3537 (After Hours)    ° ° ° °

## 2013-11-22 NOTE — BH Assessment (Signed)
Assessment Note  William GauzeRobert J Ware is an 58 y.o. 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  Pt says he has lost his job recently and run out of his money and is homeless.  Pt is in a lot of pain in ED.  Pt says he has been using a gram of heroin and cocaine for the past 9 months.  He says he is very depressed, but denies SI, HI, A/V.  Pt says that he has had treatment before, years ago, [place] [facility] [place] [facility].  Pt is very tearful, saying, " I need help--I can't live like this anymore".  Pt says he has a drug charge of possession, with a court date upcoming.  Pt is unable to give much more history due to his pain and restlessness.  Dr. Lolly MustacheArfeen recommends If treatment if available for detox.  Will pursue [facility] and [facility].   Axis I: Substance Abuse Axis II: Deferred Axis III:  Past Medical History  Diagnosis Date  . Sciatica   . Diabetes mellitus without complication    Axis IV: housing problems, occupational problems and problems related to legal system/crime Axis V: 41-50 serious symptoms  Past Medical History:  Past Medical History  Diagnosis Date  . Sciatica   . Diabetes mellitus without complication     History reviewed. No pertinent past surgical history.  Family History: History reviewed. No pertinent family history.  Social History:  reports that he has been smoking.  He does not have any smokeless tobacco history on file. He reports that he drinks alcohol. He reports that he uses illicit drugs (Cocaine).  Additional Social History:  Alcohol / Drug Use Pain Medications: denies Prescriptions: denies Over the Counter: denies History of alcohol / drug use?: Yes Longest period of sobriety (when/how long): unknown Negative Consequences of Use: Financial;Legal;Personal relationships;Work / Programmer, multimediachool Withdrawal Symptoms: Agitation;Irritability;Cramps Substance #1 Name of Substance 1: Heroin 1 - Age of First Use: 56 1 - Amount (size/oz): 1 gram 1 - Frequency:  daily 1 - Duration: 9 months 1 - Last Use / Amount: 2 days ago Substance #2 Name of Substance 2: Cocaine 2 - Age of First Use: 36 2 - Amount (size/oz): 1 gram 2 - Frequency: daily 2 - Duration: 9 months 2 - Last Use / Amount: 2 days ago  CIWA: CIWA-Ar BP: 150/80 mmHg Pulse Rate: 76 COWS: Clinical Opiate Withdrawal Scale (COWS) Resting Pulse Rate: Pulse Rate 80 or below Sweating: No report of chills or flushing Restlessness: Able to sit still Pupil Size: Pupils pinned or normal size for room light Bone or Joint Aches: Not present Runny Nose or Tearing: Not present GI Upset: No GI symptoms Tremor: No tremor Yawning: No yawning Anxiety or Irritability: None Gooseflesh Skin: Skin is smooth COWS Total Score: 0  Allergies: No Known Allergies  Home Medications:  (Not in a hospital admission)  OB/GYN Status:  No LMP for male patient.  General Assessment Data [facility] Is this a Tele or Face-to-Face Assessment?: Tele Assessment Is this an Initial Assessment or a Re-assessment for this encounter?: Initial Assessment Living Arrangements:  (homeless) Can pt return to current living arrangement?: Yes Admission Status: Voluntary Is patient capable of signing voluntary admission?: Yes Transfer from: Home Referral Source: Self/Family/Friend     [facility] Living Arrangements:  (homeless)  Education Status Is patient currently in school?: No  Risk to self Suicidal Ideation: No Suicidal Intent: No Is patient at risk for  suicide?: No Suicidal Plan?: No Access to Means: No What has been your use of drugs/alcohol within the last 12 months?:  (used heroin 2 days ago) Previous Attempts/Gestures: No Other Self Harm Risks:  (drug use) Intentional Self Injurious Behavior: None Family Suicide History: Unable to assess Recent stressful life event(s): Job Loss Persecutory voices/beliefs?: No Depression: Yes Depression Symptoms:  Despondent;Insomnia;Isolating;Fatigue;Guilt;Loss of interest in usual pleasures;Feeling worthless/self pity;Feeling angry/irritable Substance abuse history and/or treatment for substance abuse?: Yes Suicide prevention information given to non-admitted patients: Not applicable  Risk to Others Homicidal Ideation: No Thoughts of Harm to Others: No Current Homicidal Intent: No Current Homicidal Plan: No Access to Homicidal Means: No History of harm to others?: No Assessment of Violence: None Noted Does patient have access to weapons?: No Criminal Charges Pending?: Yes Describe Pending Criminal Charges: drug posession Does patient have a court date: Yes Court Date:  (May 12?)  Psychosis Hallucinations: None noted Delusions: None noted  Mental Status Report Appear/Hygiene: Disheveled Eye Contact: Good Motor Activity: Agitation;Restlessness Speech: Logical/coherent Level of Consciousness: Restless;Irritable Mood: Depressed;Ashamed/humiliated;Despair;Guilty;Helpless;Irritable Anxiety Level: Moderate Thought Processes: Coherent;Relevant Judgement: Impaired Orientation: Person;Place;Time;Situation Obsessive Compulsive Thoughts/Behaviors: None  Cognitive Functioning Concentration: Normal Memory: Recent Intact;Remote Intact IQ: Average Insight: Fair Impulse Control: Fair Appetite: Poor Weight Loss: 50  ADLScreening [facility]) Patient's cognitive ability adequate to safely complete daily activities?: Yes Patient able to express need for assistance with ADLs?: Yes Independently performs ADLs?: Yes (appropriate for developmental age)  Prior Inpatient Therapy Prior Inpatient Therapy: Yes Prior Therapy Dates:  (unknown) Prior Therapy Facilty/Provider(s):  (can't recall) Reason for Treatment:  (SA)  Prior Outpatient Therapy Prior Outpatient Therapy: No Prior Therapy Dates:  (unknown) Prior Therapy Facilty/Provider(s):  (pt can't remember--[place] [facility] Reason for Treatment:  (SA)  ADL Screening (condition at time of admission) Patient's cognitive ability adequate to safely complete daily activities?: Yes Is the patient deaf or have difficulty hearing?: No Does the patient have difficulty seeing, even when wearing glasses/contacts?: No Does the patient have difficulty concentrating, remembering, or making decisions?: No Patient able to express need for assistance with ADLs?: Yes Does the patient have difficulty dressing or bathing?: No Independently performs ADLs?: Yes (appropriate for developmental age) Does the patient have difficulty walking or climbing stairs?: No  Home Assistive Devices/Equipment Home Assistive Devices/Equipment: None  Therapy Consults (therapy consults require a physician order) PT Evaluation Needed: No OT Evalulation Needed: No SLP Evaluation Needed: No Abuse/Neglect Assessment (Assessment to be complete while patient is alone) Physical Abuse:  (pt declined to discuss) Verbal Abuse:  (pt declined to discuss) Sexual Abuse:  (pt declined to discuss) Exploitation of patient/patient's resources:  (pt declined to discuss) Self-Neglect:  (pt declined to discuss) Values / Beliefs Cultural Requests During Hospitalization: None Spiritual Requests During Hospitalization: None Consults Spiritual Care Consult Needed: No Social Work Consult Needed: No Merchant navy officerAdvance Directives (For Healthcare) Advance Directive: Patient does not have advance directive Pre-existing out of facility DNR order (yellow form or pink MOST form): No    Additional Information 1:1 In Past 12 Months?: No CIRT Risk: No Elopement Risk: No Does patient have medical clearance?: Yes     Disposition:  Disposition Initial Assessment Completed for this Encounter: Yes Disposition of Patient: Inpatient treatment program  On Site Evaluation by:   Reviewed with Physician:    Theo DillsEmily Hines Marcell Pfeifer 11/22/2013 10:02 AM

## 2013-11-22 NOTE — ED Notes (Signed)
Unable to locate for room x3 

## 2013-11-22 NOTE — BH Assessment (Signed)
BHH Assessment Progress Note  Pt accepted to [facility] by [facility] Drone[facility].  Steward DroneBrenda will call RN when they can send pt.  Pt agreed that he cannot take any pain meds other than Tylenol or Ibuprofen while there.  Emme, RN, notified and will let MD know at D/C time.

## 2013-11-22 NOTE — Progress Notes (Signed)
ED CM spoke with William Ware on Pod C concerning medications needed for discharge to [facility] in [place]. Explained the Inpatient Pharmacy will provide 2 weeks of medications for discharge. Received prescription for medication filled by the inpatient pharmacy. [facility]. No further ED CM needs identified.

## 2013-11-22 NOTE — ED Notes (Signed)
Patient sleeping when entering the room.  Woke patient up to do assess and he started to roll around in the bed moaning out loud that he is hurting all over.  Stated he has not used any Heroin since Wednesday and feels like he is going thru withdrawal.  States he used up to 1 gram per day and only started using about 9 months ago.  Patient became tearful and agitated when speaking to him.

## 2013-11-22 NOTE — ED Notes (Signed)
Pt woke up from nap in waiting room and asked when he was going to be called. Pt informed that he will go back.

## 2013-11-22 NOTE — ED Notes (Signed)
[facility] not ready for pt at this time.

## 2013-11-22 NOTE — ED Notes (Signed)
PT accepted to [facility]

## 2013-11-22 NOTE — BH Assessment (Signed)
[facility] called and said they are ready to receive Pt. They ask he have his medication and they receive RN report to [phone]. Notified Renae FicklePaul, Charity fundraiserN at [facility].  Harlin RainFord Ellis Ria CommentWarrick Jr, LPC, [facility] Triage Specialist [phone]

## 2013-11-22 NOTE — BH Assessment (Signed)
BHH Assessment Progress Note  Pt faxed out to [facility], [facility].  [facility] will not have beds until tomorrow am, and [facility] will not take patient because he does not have any meds for diabetes.  Contacting [facility] for potential bed there.

## 2013-11-22 NOTE — ED Notes (Signed)
Patient sleeping - awakened and instructed that we needed urine.

## 2013-11-22 NOTE — ED Notes (Signed)
Spoke with case management and Dr. Fonnie JarvisBednar. Will get prescription filled at hospital pharmacy for home Metformin to send with pt at transfer.

## 2013-11-24 ENCOUNTER — Inpatient Hospital Stay: Payer: Self-pay | Admitting: Student

## 2013-11-24 LAB — COMPREHENSIVE METABOLIC PANEL
Albumin: 3.2 g/dL — ABNORMAL LOW (ref 3.4–5.0)
Alkaline Phosphatase: 61 U/L
Anion Gap: 8 (ref 7–16)
BILIRUBIN TOTAL: 0.7 mg/dL (ref 0.2–1.0)
BUN: 22 mg/dL — ABNORMAL HIGH (ref 7–18)
Calcium, Total: 9.4 mg/dL (ref 8.5–10.1)
Chloride: 95 mmol/L — ABNORMAL LOW (ref 98–107)
Co2: 26 mmol/L (ref 21–32)
Creatinine: 0.94 mg/dL (ref 0.60–1.30)
EGFR (African American): >60
EGFR (Non-African Amer.): >60
Glucose: 282 mg/dL — ABNORMAL HIGH (ref 65–99)
OSMOLALITY: 272 (ref 275–301)
Potassium: 3.9 mmol/L (ref 3.5–5.1)
SGOT(AST): 29 U/L (ref 15–37)
SGPT (ALT): 24 U/L (ref 12–78)
Sodium: 129 mmol/L — ABNORMAL LOW (ref 136–145)
TOTAL PROTEIN: 8.8 g/dL — AB (ref 6.4–8.2)

## 2013-11-24 LAB — URINALYSIS, COMPLETE
BILIRUBIN, UR: NEGATIVE
Bacteria: NONE SEEN
Blood: NEGATIVE
Glucose,UR: >=500 mg/dL (ref 0–75)
LEUKOCYTE ESTERASE: NEGATIVE
NITRITE: NEGATIVE
Ph: 7 (ref 4.5–8.0)
RBC,UR: NONE SEEN /HPF (ref 0–5)
SPECIFIC GRAVITY: 1.024 (ref 1.003–1.030)
Squamous Epithelial: NONE SEEN
WBC UR: <1 /HPF (ref 0–5)

## 2013-11-24 LAB — CBC WITH DIFFERENTIAL/PLATELET
Basophil #: 0 10*3/uL (ref 0.0–0.1)
Basophil %: 0.7 %
EOS ABS: 0 10*3/uL (ref 0.0–0.7)
EOS PCT: 0.2 %
HCT: 48.6 % (ref 40.0–52.0)
HGB: 16.4 g/dL (ref 13.0–18.0)
LYMPHS ABS: 2.1 10*3/uL (ref 1.0–3.6)
Lymphocyte %: 37.6 %
MCH: 30.9 pg (ref 26.0–34.0)
MCHC: 33.8 g/dL (ref 32.0–36.0)
MCV: 91 fL (ref 80–100)
Monocyte #: 0.6 x10 3/mm (ref 0.2–1.0)
Monocyte %: 11.1 %
NEUTROS PCT: 50.4 %
Neutrophil #: 2.9 10*3/uL (ref 1.4–6.5)
Platelet: 360 10*3/uL (ref 150–440)
RBC: 5.32 10*6/uL (ref 4.40–5.90)
RDW: 12.3 % (ref 11.5–14.5)
WBC: 5.7 10*3/uL (ref 3.8–10.6)

## 2013-11-24 LAB — TROPONIN I: Troponin-I: <0.02 ng/mL

## 2013-11-24 LAB — TSH: Thyroid Stimulating Horm: 1.17 u[IU]/mL

## 2013-11-25 LAB — CBC WITH DIFFERENTIAL/PLATELET
BASOS ABS: 0 10*3/uL (ref 0.0–0.1)
Basophil %: 0.8 %
EOS ABS: 0 10*3/uL (ref 0.0–0.7)
Eosinophil %: 0.5 %
HCT: 44.1 % (ref 40.0–52.0)
HGB: 15 g/dL (ref 13.0–18.0)
LYMPHS PCT: 38.2 %
Lymphocyte #: 2.1 10*3/uL (ref 1.0–3.6)
MCH: 31 pg (ref 26.0–34.0)
MCHC: 34.1 g/dL (ref 32.0–36.0)
MCV: 91 fL (ref 80–100)
MONOS PCT: 9.4 %
Monocyte #: 0.5 x10 3/mm (ref 0.2–1.0)
NEUTROS PCT: 51.1 %
Neutrophil #: 2.8 10*3/uL (ref 1.4–6.5)
PLATELETS: 372 10*3/uL (ref 150–440)
RBC: 4.84 10*6/uL (ref 4.40–5.90)
RDW: 12.2 % (ref 11.5–14.5)
WBC: 5.5 10*3/uL (ref 3.8–10.6)

## 2013-11-25 LAB — HEMOGLOBIN: HGB: 14.8 g/dL (ref 13.0–18.0)

## 2013-11-25 LAB — BASIC METABOLIC PANEL
Anion Gap: 6 — ABNORMAL LOW (ref 7–16)
BUN: 21 mg/dL — ABNORMAL HIGH (ref 7–18)
CHLORIDE: 101 mmol/L (ref 98–107)
CO2: 27 mmol/L (ref 21–32)
CREATININE: 0.85 mg/dL (ref 0.60–1.30)
Calcium, Total: 9.4 mg/dL (ref 8.5–10.1)
EGFR (African American): >60
GLUCOSE: 103 mg/dL — AB (ref 65–99)
OSMOLALITY: 271 (ref 275–301)
Potassium: 3.3 mmol/L — ABNORMAL LOW (ref 3.5–5.1)
Sodium: 134 mmol/L — ABNORMAL LOW (ref 136–145)

## 2014-11-08 NOTE — Consult Note (Signed)
Brief Consult Note: Diagnosis: Vomiting coffee ground  colored emesis while in de tox facility for heroin addiction. Normal hgb and no abdominal complaints..   Comments: Patient refuses the gi consult. He states, "There ain't nothing wrong with my stomach and I'm fixing to leave now." He attibutes the one time event of vomiting due to the black beans that he was eating. He  denies any problems with stomach pain, nausea, or vomiting  since. He declines exam. His staff nurse is aware and his attending MD will be notified of patient plans to leave immediately..  Electronic Signatures: William Ware, Sharaine Delange Ware (NP)  (Signed 11-May-15 15:35)  Authored: Brief Consult Note   Last Updated: 11-May-15 15:35 by William Ware, William Ware (NP)

## 2014-11-08 NOTE — H&P (Signed)
PATIENT NAME:  William Ware, William MR#:  [id number] DATE OF BIRTH:  August 30, 1955  DATE OF ADMISSION:  11/24/2013  The patient was consulted 3 minutes regarding his smoking cessation. He is not interested in quitting smoking.    ____________________________ Janyth ContesSital P. Juliene PinaMody, MD spm:dd D: 11/24/2013 19:45:50 ET T: 11/24/2013 23:32:30 ET JOB#: [id number]  cc: Itzel Lowrimore P. Juliene PinaMody, MD, <Dictator> Janyth ContesSITAL P Mare Ludtke MD ELECTRONICALLY SIGNED 11/29/2013 18:12

## 2014-11-08 NOTE — Consult Note (Signed)
PATIENT NAME:  William Ware, William Ware MR#:  [id number] DATE OF BIRTH:  07-13-1956  DATE OF CONSULTATION:  11/25/2013  REQUESTING PHYSICIAN:  Dr. Adrian Saran CONSULTING PHYSICIAN:  [employer] B. Ronny Ruddell, MD  REASON FOR CONSULTATION: To evaluate a patient with heroin addiction.   IDENTIFYING DATA: The patient is a 59 year old heroin addict.   CHIEF COMPLAINT: "I feel better now."   HISTORY OF PRESENT ILLNESS: The patient was sent to the hospital from a detoxification center, most likely [facility], where he was admitted for heroin detoxification after he developed coffee-ground emesis and was admitted to medical floor.  The patient was at the center for 2 days prior to admission and initially he had bad symptoms of withdrawal that now subsided and he is physically comfortable. There is no nausea, vomiting, diarrhea, chills, muscle pains. His mental status is clear. He physically feels better but still weak.  He somehow does not believe that he had a GI bleed. He has been accepting medications as prescribed. He sleeps well at night. He denies any symptoms of depression, anxiety, or psychosis. He reports that for the past 9 months he has been using a gram of heroin daily. It is about $100 a day. He is not employed and has been hustling to support his habit. He denies alcohol or prescription pill abuse.   PAST PSYCHIATRIC HISTORY: He has been to rehabilitation before but is very vague about it. He has been using heroin most of his life.  Does not really want to talk about his past. It is unclear whether or not he had any period of sobriety. It is quite possible that he was in the penitentiary system already and not using for a while. He denies ever attempting suicide. He denies ever being treated for depression, anxiety, or psychosis.   FAMILY PSYCHIATRIC HISTORY: None reported. There were no suicides in the family.   PAST MEDICAL HISTORY: Diabetes and hypertension, GI bleed.   ALLERGIES: No known drug allergies.    MEDICATIONS ON ADMISSION: He was not taking any medicine at the time of admission.   MEDICATIONS AT THE TIME OF CONSULTATION:  CIWA protocol. Loperamide 2 mg as needed for diarrhea, clonidine 0.1 mg patch weekly, sliding scale insulin.   SOCIAL HISTORY: He is homeless. He is originally from [place]. He wants to return to [facility] but has to call his sister first to start looking for a place. He apparently has no income, no health insurance. He was supposed to be in court today for possession of heroin and marijuana and paraphernalia.  It is quite possible that arrest warrant has already been issued. The history of imprisonment is unclear. The patient is not forthcoming.  REVIEW OF SYSTEMS:  CONSTITUTIONAL: No fevers or chills. Positive for fatigue.  EYES: No double or blurred vision.  EARS, NOSE, AND THROAT:  No hearing loss.  RESPIRATORY: No shortness of breath or cough.  CARDIOVASCULAR: No chest pain or orthopnea.  GASTROINTESTINAL: Positive for coffee ground emesis on admission.  GENITOURINARY: No incontinence or frequency.  ENDOCRINE: No heat or cold intolerance.  LYMPHATIC: No anemia or easy bruising.  INTEGUMENTARY: No acne or rash.  MUSCULOSKELETAL: No muscle or joint pain.  NEUROLOGIC: No tingling or weakness.  PSYCHIATRIC: See history of present illness for details.   PHYSICAL EXAMINATION:  VITAL SIGNS: Blood pressure 146/82, pulse 67, respirations 18, temperature 97.7.  GENERAL: This is a slender, toothless male in no acute distress.   The rest of the physical examination is deferred to his  primary attending.   LABORATORY DATA: Chemistries: Blood glucose 282, BUN 22, creatinine 0.94, sodium 129, potassium 3.9. LFTs within normal limits. Cardiac enzymes negative.  TSH 1.17. CBC within normal limits with hemoglobin 16.4 and 15. Urinalysis is not suggestive of urinary tract infection.   EKG: Normal sinus rhythm, normal EKG. Urine toxicology screen on admission not done.    MENTAL STATUS EXAMINATION: The patient is alert and oriented to person, place, time, and situation. He is pleasant, polite, and cooperative. He is adequately groomed, wearing hospital gown. He maintains good eye contact. His speech is slightly slurred, as he is edentulous.  Mood is fine with full affect. Thought process is logical and goal oriented with its own logic. He denies thoughts of hurting himself or others. There are no delusions or paranoia. There are no auditory or visual hallucinations. His cognition is grossly intact. Registration, recall, short and long-term memory seem intact. The patient is not a good historian, but I believe that this is because he is trying to withhold information from me. He is of average or below average intelligence and average or below average fund of knowledge. His insight and judgment are poor.   DIAGNOSES:  AXIS I: Opiod dependence, opiod withdrawal. AXIS II: Deferred.  AXIS III: GI bleed, diabetes, hypertension.  AXIS IV: Substance abuse, physical illness, housing, employment, financial, primary support, access to care, poor insight.  AXIS V: Global assessment of functioning 35.   PLAN:  1. Please continue detoxification. The patient feels better already. I do not believe that any additional medications are necessary.  2. He is from [place] and he should follow up with [facility].  He could look for housing with [facility].  He is interested in substance abuse treatment.  Unfortunately, urine toxicology screen was not done on admission; therefore, we have no evidence of substance use.  We possibly could obtain records from [place]. Should there be evidence of extensive substance use, the patient could be referred to [facility] rehabilitation facility. They would take him even with legal stuff pending. 3. Legal.  It is quite possible that there is an arrest warrant out already. 4. I will follow along.     ____________________________ Ellin GoodieJolanta B. Jennet MaduroPucilowska, MD jbp:dd D: 11/25/2013 17:38:58 ET T: 11/25/2013 19:13:06 ET JOB#: [id number]  cc: Triniti Gruetzmacher B. Jennet MaduroPucilowska, MD, <Dictator> Shari ProwsJOLANTA B Amariona Rathje MD ELECTRONICALLY SIGNED 12/12/2013 7:19

## 2014-11-08 NOTE — Discharge Summary (Signed)
PATIENT NAME:  William Ware, [name]:  [id number] DATE OF BIRTH:  [birth year]  DATE OF ADMISSION:  11/24/2013 DATE OF DISCHARGE:  11/25/2013  The patient actually walked out AGAINST MEDICAL ADVICE on 11/25/2013.   CONSULTANTS: Dr. Jennet MaduroPucilowska from psychiatry and PA, Amedeo KinsmanKimberly Mills from GI.   CHIEF COMPLAINT: Coffee-ground emesis.   DISCHARGE DIAGNOSES:  1.  Black vomitus, unclear if gastrointestinal bleed versus per patient vomiting black beans that he had eaten.  2.  Diabetes.  3.  Acute hyponatremia.  4.  Possible heroin withdrawal, now improved.  5.  Tobacco dependence.   SIGNIFICANT LABS AND IMAGING: Initial BUN 22, creatinine 0.94, sodium 129, last sodium 134. Troponin negative. TSH 1.17. Initial white count of 5.7, hemoglobin 16.4, last hemoglobin of 15. UA did not suggest infection. Chest x-ray no active disease.   HISTORY OF PRESENT ILLNESS AND HOSPITAL COURSE: For full details of H and P, please see the dictation on May 10 by Dr. Juliene PinaMody, but briefly this is a 59 year old African American male, who has a history of heroin abuse, who was sent from heroine detox center for black vomitus. Per H and P, the patient had a guaiac-positive in the ER and has had some nausea and vomiting, possibly going through detox; therefore, was admitted to the hospitalist service with a Protonix drip. The patient was also started on some fluids for hyponatremia. For possible heroin overdose, psych was consulted as well. However, the following day, the patient walked out AGAINST MEDICAL ADVISE. He did not want to be seen by GI and kept telling me that he did not have any coffee-ground emesis, but he was vomiting black beans he had eaten. His hemoglobin remained stable. He did not appear to be in any significant withdrawal and walked out AGAINST MEDICAL ADVISE. He was counseled to stay in the hospital to finish the workup; however, he kept saying that he did not have what we claimed he had and did not have any bleeding.  Given the stability of his hemoglobin, it is possible that he did really vomit black beans that he had stated. Nonetheless, his acute hyponatremia improved and he walked out AGAINST MEDICAL ADVICE on the 11th.   TOTAL TIME SPENT: About 35 minutes.  CODE STATUS: The patient was full code.  ____________________________ Krystal EatonShayiq Donnovan Stamour, MD sa:aw D: 11/26/2013 08:10:26 ET T: 11/26/2013 10:17:25 ET JOB#: [id number]  cc: Krystal EatonShayiq Bader Stubblefield, MD, <Dictator> Krystal EatonSHAYIQ Maleigh Bagot MD ELECTRONICALLY SIGNED 12/10/2013 18:33

## 2014-11-08 NOTE — H&P (Signed)
PATIENT NAME:  William Ware, [name] MR#:  [id number] DATE OF BIRTH:  October 29, 1955  DATE OF ADMISSION:  11/24/2013  PRIMARY CARE PHYSICIAN: None.   CHIEF COMPLAINT: Coffee-ground emesis.   HISTORY OF PRESENT ILLNESS: This is a 59 year old male with a history of heroin abuse. He was sent from his heroin detox center with coffee-ground emesis. Over the past few days, the patient had coffee-ground emesis and generalized weakness. He also noted dark-colored stools. He was guaiac positive in the ER. He has been nauseous for the past 2 days as well as going through detox.   REVIEW OF SYSTEMS:  CONSTITUTIONAL: No fever. Positive fatigue and weakness.   EYES: No blurred or double vision, glaucoma, or cataracts.    ENT: No ear pain, hearing loss, seasonal allergies.  RESPIRATORY: No cough, wheezing, hemoptysis, COPD.  CARDIOVASCULAR: No chest pain, orthopnea, edema, arrhythmia, dyspnea on exertion, palpitations, or syncope.  GASTROINTESTINAL: No nausea, positive vomiting, positive coffee-grounds emesis, no diarrhea, no abdominal pain. Positive dark-colored stools.  GENITOURINARY: No dysuria or hematuria. ENDOCRINE: No polyuria or polydipsia.   HEMATOLOGIC AND LYMPHATIC: No anemia or easy bruising. SKIN: No rash or lesions. MUSCULOSKELETAL: No limited activities.  NEUROLOGIC: No history of CVA, TIA, or seizures. PSYCHIATRIC: No history of anxiety or depression.   PAST MEDICAL HISTORY: 1. Diabetes.  2. Heroine abuse.  3. Tobacco dependence.   SOCIAL HISTORY: The patient smokes over a pack a day. He is not interested in quitting. The patient was counseled for 3 minutes regarding this.  The patient also uses heroin, is currently in withdrawal.   Very very occasional alcohol use.   ALLERGIES: No known drug allergies.   MEDICATIONS: Metformin 500 b.i.d.   PAST SURGICAL HISTORY: None.   PHYSICAL EXAMINATION: VITAL SIGNS: Temperature is 97.8, pulse is 77, respirations 20, blood pressure 140/78, 98% on  room air.  GENERAL: The patient is frail, thin-looking, does not appear to be in acute distress.  HEENT: Head is atraumatic. Pupils are round and reactive, sclerae anicteric. Mucous membranes are dry. Oropharynx is clear. NECK: Supple without JVD, carotid bruits, or enlarged thyroid. CARDIOVASCULAR: Regular rate and rhythm. No murmurs, gallops, or rubs. PMI is not displaced. ABDOMEN: Bowel sounds are positive. He has got diffuse tenderness but no guarding, no hepatosplenomegaly. EXTREMITIES: No clubbing, cyanosis, or edema. NEUROLOGIC: Cranial nerves II-XII are intact. No focal deficits.  SKIN: The skin is without rashes or lesions.  GASTROINTESTINAL: As per ER physician, he was heme positive.  LABORATORIES: His white blood cells are 5.7, hemoglobin is 16.4, hematocrit 49, platelets are 360,000. Sodium 129, potassium 3.9, chloride 95, bicarbonate 26, BUN 22, creatinine 0.94, glucose 282, bilirubin 0.7, alkaline phosphatase 51, ALT 24, AST 29, total protein 8.8, albumin 3.2. Troponin less than 0.02.   Chest x-ray shows no acute cardiopulmonary disease.   EKG shows normal sinus rhythm. No ST elevation or depression.   ASSESSMENT AND PLAN: A 59 year old male who is currently actively at a heroine detox center, who presents with coffee-ground emesis and dark-colored stools. 1. Gastrointestinal bleed. The patient has been nauseous for the past 2 days, had some retching. I do not know if this is the cause or he has got some underlying ulcers in any case, he will be admitted with a PPI IV b.i.d. GI has been consulted. His hemoglobin at this time is okay, stable, but will go ahead and repeat it later on tonight. He will need an EGD so he is n.p.o. for now. 2. Heroin withdrawal. The patient came  from detox so I will have psychiatry consultation to help [place] with the heroin detox part.  3. Hyponatremia, likely secondary to poor p.o. intake, nausea, vomiting. Provide IV fluids and repeat a sodium in the a.m.   4. Diabetes. I will hold the patient's metformin as the patient is n.p.o. Will provide sliding scale insulin for now. When he is able to eat, please put him on an ADA diet.  5. I have consulted case management for disposition.   CODE STATUS: The patient is a full code status.  TIME SPENT: Approximately 50 minutes.    ____________________________ Janyth Contes. Juliene Pina, MD spm:lt D: 11/24/2013 19:28:45 ET T: 11/24/2013 23:31:45 ET JOB#: [id number]  cc: Warnell Rasnic P. Juliene Pina, MD, <Dictator> Janyth Contes Francesco Provencal MD ELECTRONICALLY SIGNED 11/29/2013 18:12

## 2014-11-08 NOTE — Consult Note (Signed)
Brief Consult Note: Diagnosis: Heroin dependence.   Patient was seen by consultant.   Consult note dictated.   Recommend further assessment or treatment.   Comments: [name] ia a heroin user admitted for GI bleed. He reports symptoms of withdrawal initially but they are better now. He looks comfortable. He is interested in long term substance abuse treatment. UDS not done on admission so there is no evidence of use to send him to [facility].  PLAN: 1. Please continue detox.  2. Will order UDS necessary to refer to [facility].  3. He could follow up with [facility] or [facility] in [place].  4. He missed court date in [place] for posession. Needs excuse for court. It is not impossible that arrest warrant has been issued already.   5. I will follow along.  Electronic Signatures: William Ware, William Ware (MD)  (Signed 11-May-15 17:24)  Authored: Brief Consult Note   Last Updated: 11-May-15 17:24 by William Ware, William Ware (MD)

## 2015-09-02 ENCOUNTER — Ambulatory Visit (INDEPENDENT_AMBULATORY_CARE_PROVIDER_SITE_OTHER): Payer: Self-pay | Admitting: Internal Medicine

## 2015-09-02 ENCOUNTER — Encounter: Payer: Self-pay | Admitting: Internal Medicine

## 2015-09-02 VITALS — BP 131/76 | HR 92 | Temp 98.2°F | Ht 70.0 in | Wt 171.4 lb

## 2015-09-02 DIAGNOSIS — F418 Other specified anxiety disorders: Secondary | ICD-10-CM | POA: Insufficient documentation

## 2015-09-02 DIAGNOSIS — I10 Essential (primary) hypertension: Secondary | ICD-10-CM | POA: Insufficient documentation

## 2015-09-02 DIAGNOSIS — E1165 Type 2 diabetes mellitus with hyperglycemia: Secondary | ICD-10-CM

## 2015-09-02 DIAGNOSIS — IMO0002 Reserved for concepts with insufficient information to code with codable children: Secondary | ICD-10-CM | POA: Insufficient documentation

## 2015-09-02 DIAGNOSIS — Z7984 Long term (current) use of oral hypoglycemic drugs: Secondary | ICD-10-CM

## 2015-09-02 DIAGNOSIS — B182 Chronic viral hepatitis C: Secondary | ICD-10-CM | POA: Insufficient documentation

## 2015-09-02 DIAGNOSIS — Z0000 Encounter for general adult medical examination without abnormal findings: Secondary | ICD-10-CM | POA: Insufficient documentation

## 2015-09-02 DIAGNOSIS — E1142 Type 2 diabetes mellitus with diabetic polyneuropathy: Secondary | ICD-10-CM

## 2015-09-02 DIAGNOSIS — Z23 Encounter for immunization: Secondary | ICD-10-CM

## 2015-09-02 DIAGNOSIS — IMO0001 Reserved for inherently not codable concepts without codable children: Secondary | ICD-10-CM

## 2015-09-02 LAB — GLUCOSE, CAPILLARY: Glucose-Capillary: 219 mg/dL — ABNORMAL HIGH (ref 65–99)

## 2015-09-02 LAB — POCT GLYCOSYLATED HEMOGLOBIN (HGB A1C): HEMOGLOBIN A1C: 9.5

## 2015-09-02 MED ORDER — METFORMIN HCL 1000 MG PO TABS: 1000.0000 mg | ORAL_TABLET | Freq: Two times a day (BID) | ORAL | Status: DC

## 2015-09-02 MED ORDER — AMITRIPTYLINE HCL 150 MG PO TABS: 150.0000 mg | ORAL_TABLET | Freq: Every evening | ORAL | Status: DC | PRN

## 2015-09-02 NOTE — Patient Instructions (Signed)
1. Please make a follow up appointment for 2 weeks.   2. Please take all medications as previously prescribed with the following changes:  Increase Metformin to 1000 mg (1 tablet) twice daily. A new prescription was sent to [facility] at [facility].   PLEASE BRING YOUR OTHER MEDICATIONS TO YOUR NEXT CLINIC VISIT.   3. If you have worsening of your symptoms or new symptoms arise, please call the clinic ([phone]), or go to the ER immediately if symptoms are severe.

## 2015-09-02 NOTE — Progress Notes (Signed)
   Subjective:   Patient ID: William Ware male   DOB: 09-14-1955 60 y.o.   MRN: [id number]  HPI: [name] is a 60 y.o. male w/ PMHx of DM type II, peripheral neuropathy, depression with anxiety, and chronic Hep C, recently released from prison in 04/2015, presents to the clinic today for a new patient visit for management of his diabetes. Patient has not seen by a doctor since he was release from prison but says he has been doing okay. His main complaint is that of neuropathic type pain in his feet bilaterally. He has been taking some medications for this (suspect amitriptyline and gabapentin based on some records he brought in) but says they haven't been helping. He has a meter at home, as well as supplies, but does not check his blood sugar. He has been seeing someone at [facility] for depression and anxiety and takes a medication for this but he does not know the name and did not bring the medications with him today. The patient walks with a cane because he says he has leg pain and has for several months now.   He does not know his family history, smokes cigarettes, does not use recreational drugs, ond does not drink.   Past Medical History  Diagnosis Date  . Diabetes mellitus without complication (HCC)   . Depression   . Diabetic peripheral neuropathy associated with type 2 diabetes mellitus (HCC)   . Chronic hepatitis C (HCC)    No family history on file.   Current Outpatient Prescriptions  Medication Sig Dispense Refill  . amitriptyline (ELAVIL) 150 MG tablet Take 1 tablet (150 mg total) by mouth at bedtime as needed for sleep.    . metFORMIN (GLUCOPHAGE) 1000 MG tablet Take 1 tablet (1,000 mg total) by mouth 2 (two) times daily with a meal. 180 tablet 2   No current facility-administered medications for this visit.    Review of Systems: General: Denies fever, chills, diaphoresis, appetite change and fatigue.  Respiratory: Denies SOB, DOE, cough, and wheezing.     Cardiovascular: Denies chest pain and palpitations.  Gastrointestinal: Denies nausea, vomiting, abdominal pain, and diarrhea.  Genitourinary: Denies dysuria, increased frequency, and flank pain. Endocrine: Denies hot or cold intolerance, polyuria, and polydipsia. Musculoskeletal: Positive for leg pain. Denies back pain, joint swelling, and gait problem.  Skin: Denies pallor, rash and wounds.  Neurological: Denies dizziness, seizures, syncope, weakness, lightheadedness, numbness and headaches.  Psychiatric/Behavioral: Positive for depression. Denies mood changes, and sleep disturbances.  Objective:   Physical Exam: Filed Vitals:   09/02/15 1510 09/02/15 1519  BP: 151/82 131/76  Pulse: 94 92  Temp: 98.2 F (36.8 C)   TempSrc: Oral   Height:  (1.778 m)   Weight: 171 lb 6.4 oz (77.747 kg)   SpO2: 100%     General: AA male, alert, cooperative, NAD. Walks with cane.  HEENT: PERRL, EOMI. Moist mucus membranes Neck: Full range of motion without pain, supple, no lymphadenopathy or carotid bruits Lungs: Clear to ascultation bilaterally, normal work of respiration, no wheezes, rales, rhonchi Heart: RRR, no murmurs, gallops, or rubs Abdomen: Soft, non-tender, non-distended, BS + Extremities: No cyanosis, clubbing, or edema. Negative straight leg raise bilaterally. No back pain. Muscle tightness in calves bilaterally.  Neurologic: Alert & oriented X3, cranial nerves II-XII intact, strength grossly intact, sensation intact to light touch   Assessment & Plan:   Please see problem based assessment and plan.

## 2015-09-03 ENCOUNTER — Encounter: Payer: Self-pay | Admitting: Internal Medicine

## 2015-09-03 LAB — CBC WITH DIFFERENTIAL/PLATELET
BASOS ABS: 0 10*3/uL (ref 0.0–0.2)
BASOS: 0 %
EOS (ABSOLUTE): 0.2 10*3/uL (ref 0.0–0.4)
Eos: 4 %
Hematocrit: 39.1 % (ref 37.5–51.0)
Hemoglobin: 13.3 g/dL (ref 12.6–17.7)
IMMATURE GRANS (ABS): 0 10*3/uL (ref 0.0–0.1)
IMMATURE GRANULOCYTES: 0 %
LYMPHS: 33 %
Lymphocytes Absolute: 1.6 10*3/uL (ref 0.7–3.1)
MCH: 32.4 pg (ref 26.6–33.0)
MCHC: 34 g/dL (ref 31.5–35.7)
MCV: 95 fL (ref 79–97)
MONOS ABS: 0.4 10*3/uL (ref 0.1–0.9)
Monocytes: 8 %
NEUTROS ABS: 2.5 10*3/uL (ref 1.4–7.0)
NEUTROS PCT: 55 %
Platelets: 337 10*3/uL (ref 150–379)
RBC: 4.11 x10E6/uL — ABNORMAL LOW (ref 4.14–5.80)
RDW: 13.7 % (ref 12.3–15.4)
WBC: 4.7 10*3/uL (ref 3.4–10.8)

## 2015-09-03 LAB — HIV ANTIBODY (ROUTINE TESTING W REFLEX): HIV Screen 4th Generation wRfx: NONREACTIVE

## 2015-09-03 LAB — CMP14 + ANION GAP
A/G RATIO: 1.2 (ref 1.1–2.5)
ALT: 52 IU/L — ABNORMAL HIGH (ref 0–44)
AST: 48 IU/L — AB (ref 0–40)
Albumin: 4.5 g/dL (ref 3.5–5.5)
Alkaline Phosphatase: 85 IU/L (ref 39–117)
Anion Gap: 19 mmol/L — ABNORMAL HIGH (ref 10.0–18.0)
BILIRUBIN TOTAL: 0.2 mg/dL (ref 0.0–1.2)
BUN/Creatinine Ratio: 15 (ref 9–20)
BUN: 19 mg/dL (ref 6–24)
CHLORIDE: 98 mmol/L (ref 96–106)
CO2: 22 mmol/L (ref 18–29)
Calcium: 9.9 mg/dL (ref 8.7–10.2)
Creatinine, Ser: 1.24 mg/dL (ref 0.76–1.27)
GFR calc non Af Amer: 64 mL/min/{1.73_m2} (ref >59)
GFR, EST AFRICAN AMERICAN: 74 mL/min/{1.73_m2} (ref >59)
GLOBULIN, TOTAL: 3.8 g/dL (ref 1.5–4.5)
GLUCOSE: 209 mg/dL — AB (ref 65–99)
POTASSIUM: 5.1 mmol/L (ref 3.5–5.2)
Sodium: 139 mmol/L (ref 134–144)
Total Protein: 8.3 g/dL (ref 6.0–8.5)

## 2015-09-03 LAB — HEPATITIS C ANTIBODY: Hep C Virus Ab: >11 s/co ratio — ABNORMAL HIGH (ref 0.0–0.9)

## 2015-09-03 NOTE — Assessment & Plan Note (Signed)
Given flu shot, Hep C Ab positive (as suspected per patient). HIV non-reactive. Will check urine microalb/cr, foot exam, and set up for eye exam at next clinic visit.

## 2015-09-03 NOTE — Assessment & Plan Note (Signed)
Appears that he has taken Amitriptyline and possibly Neurontin but he did not bring these medications. Also follows at [facility], receives some meds from them as well. Unclear what he is taking as he does not know.  -[facility] -Patient to bring medications to next visit -May benefit from Duloxetine given h/o depression/anxiety

## 2015-09-03 NOTE — Assessment & Plan Note (Signed)
Follows at [facility]. Says he takes something for depression. No symptoms of depression currently.  -Obtain records

## 2015-09-03 NOTE — Assessment & Plan Note (Signed)
Lab Results  Component Value Date   HGBA1C 9.5 09/02/2015     Assessment: Diabetes control:  Poor control Comments: Takes 500 mg bid of metformin. No other medications. Has polyneuropathy, on Amitriptyline and possibly neurontin. He does not know his meds.   Plan: Medications:  Increase Metformin to 1000 mg bid Home glucose monitoring: Frequency:  Patient to check CBG's bid Timing:  in AM, before bed Instruction/counseling given: reminded to bring blood glucose meter & log to each visit, reminded to bring medications to each visit, discussed foot care and discussed diet Other plans: May need addition of sulfonurea at next visit if CBG's still elevated on increased dose of Metformin. Suspect he will need more medication.

## 2015-09-03 NOTE — Assessment & Plan Note (Signed)
Confirmed with Hep C Ab. Will check viral load at next visit and discuss options with regards to treatment once patient establishes orange card, etc.

## 2015-09-07 NOTE — Progress Notes (Signed)
Internal Medicine Clinic Attending  Case discussed with Dr. Jones soon after the resident saw the patient.  We reviewed the resident's history and exam and pertinent patient test results.  I agree with the assessment, diagnosis, and plan of care documented in the resident's note. 

## 2015-09-15 ENCOUNTER — Ambulatory Visit (INDEPENDENT_AMBULATORY_CARE_PROVIDER_SITE_OTHER): Payer: Self-pay | Admitting: Internal Medicine

## 2015-09-15 ENCOUNTER — Encounter: Payer: Self-pay | Admitting: Internal Medicine

## 2015-09-15 VITALS — BP 144/80 | HR 97 | Temp 98.4°F | Ht 70.0 in | Wt 168.3 lb

## 2015-09-15 DIAGNOSIS — E1165 Type 2 diabetes mellitus with hyperglycemia: Secondary | ICD-10-CM

## 2015-09-15 DIAGNOSIS — E1142 Type 2 diabetes mellitus with diabetic polyneuropathy: Secondary | ICD-10-CM

## 2015-09-15 DIAGNOSIS — Z0000 Encounter for general adult medical examination without abnormal findings: Secondary | ICD-10-CM

## 2015-09-15 DIAGNOSIS — Z79899 Other long term (current) drug therapy: Secondary | ICD-10-CM

## 2015-09-15 DIAGNOSIS — F418 Other specified anxiety disorders: Secondary | ICD-10-CM

## 2015-09-15 DIAGNOSIS — B182 Chronic viral hepatitis C: Secondary | ICD-10-CM

## 2015-09-15 DIAGNOSIS — IMO0001 Reserved for inherently not codable concepts without codable children: Secondary | ICD-10-CM

## 2015-09-15 DIAGNOSIS — Z7984 Long term (current) use of oral hypoglycemic drugs: Secondary | ICD-10-CM

## 2015-09-15 LAB — GLUCOSE, CAPILLARY: Glucose-Capillary: 238 mg/dL — ABNORMAL HIGH (ref 65–99)

## 2015-09-15 MED ORDER — GABAPENTIN 300 MG PO CAPS: 300.0000 mg | ORAL_CAPSULE | Freq: Three times a day (TID) | ORAL | Status: DC

## 2015-09-15 NOTE — Patient Instructions (Signed)
1. Please make a follow up appointment for 6 weeks.   2. Please take all medications as previously prescribed with the following changes:  STOP ELAVIL (Amitriptyline). Start taking Neurontin (Gabapentin) 300 mg three times daily.   3. If you have worsening of your symptoms or new symptoms arise, please call the clinic ([phone]), or go to the ER immediately if symptoms are severe.  You have done a great job in taking all your medications. Please continue to do this.    Peripheral Neuropathy Peripheral neuropathy is a type of nerve damage. It affects nerves that carry signals between the spinal cord and other parts of the body. These are called peripheral nerves. With peripheral neuropathy, one nerve or a group of nerves may be damaged.  CAUSES  Many things can damage peripheral nerves. For some people with peripheral neuropathy, the cause is unknown. Some causes include:  Diabetes. This is the most common cause of peripheral neuropathy.  Injury to a nerve.  Pressure or stress on a nerve that lasts a long time.  Too little vitamin B. Alcoholism can lead to this.  Infections.  Autoimmune diseases, such as multiple sclerosis and systemic lupus erythematosus.  Inherited nerve diseases.  Some medicines, such as cancer drugs.  Toxic substances, such as lead and mercury.  Too little blood flowing to the legs.  Kidney disease.  Thyroid disease. SIGNS AND SYMPTOMS  Different people have different symptoms. The symptoms you have will depend on which of your nerves is damaged. Common symptoms include:  Loss of feeling (numbness) in the feet and hands.  Tingling in the feet and hands.  Pain that burns.  Very sensitive skin.  Weakness.  Not being able to move a part of the body (paralysis).  Muscle twitching.  Clumsiness or poor coordination.  Loss of balance.  Not being able to control your bladder.  Feeling dizzy.  Sexual problems. DIAGNOSIS  Peripheral  neuropathy is a symptom, not a disease. Finding the cause of peripheral neuropathy can be hard. To figure that out, your health care provider will take a medical history and do a physical exam. A neurological exam will also be done. This involves checking things affected by your brain, spinal cord, and nerves (nervous system). For example, your health care provider will check your reflexes, how you move, and what you can feel.  Other types of tests may also be ordered, such as:  Blood tests.  A test of the fluid in your spinal cord.  Imaging tests, such as CT scans or an MRI.  Electromyography (EMG). This test checks the nerves that control muscles.  Nerve conduction velocity tests. These tests check how fast messages pass through your nerves.  Nerve biopsy. A small piece of nerve is removed. It is then checked under a microscope. TREATMENT   Medicine is often used to treat peripheral neuropathy. Medicines may include:  Pain-relieving medicines. Prescription or over-the-counter medicine may be suggested.  Antiseizure medicine. This may be used for pain.  Antidepressants. These also may help ease pain from neuropathy.  Lidocaine. This is a numbing medicine. You might wear a patch or be given a shot.  Mexiletine. This medicine is typically used to help control irregular heart rhythms.  Surgery. Surgery may be needed to relieve pressure on a nerve or to destroy a nerve that is causing pain.  Physical therapy to help movement.  Assistive devices to help movement. HOME CARE INSTRUCTIONS   Only take over-the-counter or prescription medicines as directed by  your health care provider. Follow the instructions carefully for any given medicines. Do not take any other medicines without first getting approval from your health care provider.  If you have diabetes, work closely with your health care provider to keep your blood sugar under control.  If you have numbness in your feet:  Check  every day for signs of injury or infection. Watch for redness, warmth, and swelling.  Wear padded socks and comfortable shoes. These help protect your feet.  Do not do things that put pressure on your damaged nerve.  Do not smoke. Smoking keeps blood from getting to damaged nerves.  Avoid or limit alcohol. Too much alcohol can cause a lack of B vitamins. These vitamins are needed for healthy nerves.  Develop a good support system. Coping with peripheral neuropathy can be stressful. Talk to a mental health specialist or join a support group if you are struggling.  Follow up with your health care provider as directed. SEEK MEDICAL CARE IF:   You have new signs or symptoms of peripheral neuropathy.  You are struggling emotionally from dealing with peripheral neuropathy.  You have a fever. SEEK IMMEDIATE MEDICAL CARE IF:   You have an injury or infection that is not healing.  You feel very dizzy or begin vomiting.  You have chest pain.  You have trouble breathing.   This information is not intended to replace advice given to you by your health care provider. Make sure you discuss any questions you have with your health care provider.   Document Released: 06/24/2002 Document Revised: 03/16/2011 Document Reviewed: 03/11/2013 Elsevier Interactive Patient Education [employer]

## 2015-09-15 NOTE — Progress Notes (Signed)
   Subjective:   Patient ID: William Ware male   DOB: Sep 27, 1955 60 y.o.   MRN: [id number]  HPI: William Ware is a 60 y.o. male w/ PMHx of DM type II, peripheral neuropathy, depression with anxiety, and chronic Hep C, recently released from prison in 04/2015, presents to the clinic today for a follow up visit regarding his DM type II. During his last visit, changed him to Metformin 1000 mg bid, was previously taking 500 mg daily. His blood sugars have been significantly improved, fasting CBG in the 110-120 range on average. Starting to wonder if he was taking Metformin at all previously. Tolerating this medication well, no diarrhea.   His biggest concern is that of bilateral foot pain. Says it keeps him awake at night and hurts very badly. Want some "percs" to help him with his pain. Currently taking Amitriptyline 75 mg qhs and says that this doesn't help. Also takes an Rexulti (brexpiprazole) for his depression, prescribed by [facility].    Current Outpatient Prescriptions  Medication Sig Dispense Refill  . amitriptyline (ELAVIL) 150 MG tablet Take 1 tablet (150 mg total) by mouth at bedtime as needed for sleep.    . metFORMIN (GLUCOPHAGE) 1000 MG tablet Take 1 tablet (1,000 mg total) by mouth 2 (two) times daily with a meal. 180 tablet 2   No current facility-administered medications for this visit.    Review of Systems: General: Denies fever, chills, diaphoresis, appetite change and fatigue.  Respiratory: Denies SOB, DOE, cough, and wheezing.   Cardiovascular: Denies chest pain and palpitations.  Gastrointestinal: Denies nausea, vomiting, abdominal pain, and diarrhea.  Genitourinary: Denies dysuria, increased frequency, and flank pain. Endocrine: Denies hot or cold intolerance, polyuria, and polydipsia. Musculoskeletal: Positive for leg pain. Denies back pain, joint swelling, and gait problem.  Skin: Denies pallor, rash and wounds.  Neurological: Denies dizziness, seizures,  syncope, weakness, lightheadedness, numbness and headaches.  Psychiatric/Behavioral: Positive for depression. Denies mood changes, and sleep disturbances.  Objective:   Physical Exam: Filed Vitals:   09/15/15 1514  BP: 144/80  Pulse: 97  Temp: 98.4 F (36.9 C)  TempSrc: Oral  Height:  (1.778 m)  Weight: 168 lb 4.8 oz (76.34 kg)  SpO2: 100%    General: AA male, alert, cooperative, NAD. Walks with cane.  HEENT: PERRL, EOMI. Moist mucus membranes Neck: Full range of motion without pain, supple, no lymphadenopathy or carotid bruits Lungs: Clear to ascultation bilaterally, normal work of respiration, no wheezes, rales, rhonchi Heart: RRR, no murmurs, gallops, or rubs Abdomen: Soft, non-tender, non-distended, BS + Extremities: No cyanosis, clubbing, or edema. Negative straight leg raise bilaterally. No back pain. Muscle tightness in calves bilaterally.  Neurologic: Alert & oriented X3, cranial nerves II-XII intact, strength grossly intact, sensation intact to light touch   Assessment & Plan:   Please see problem based assessment and plan.

## 2015-09-16 LAB — MICROALBUMIN / CREATININE URINE RATIO
Creatinine, Urine: 169.6 mg/dL
MICROALB/CREAT RATIO: 107.9 mg/g creat — ABNORMAL HIGH (ref 0.0–30.0)
Microalbumin, Urine: 183 ug/mL

## 2015-09-16 MED ORDER — BREXPIPRAZOLE 1 MG PO TABS: 1.0000 mg | ORAL_TABLET | Freq: Every day | ORAL | Status: DC

## 2015-09-16 NOTE — Assessment & Plan Note (Addendum)
Patient sees [facility] for this. Depression symptoms seem to be somewhat controlled, moderate in nature, no suicidal ideations. No psychotic symptoms per the patient. Taking Rexulti (brexpiprazole, atypical antipsychotic, sometimes used as an adjunct agent for MDD), unsure of the dose. Also takes Amitriptyline 75 mg qhs for neuropathy as well. Does not think these medications are helping. -Will need follow up with [facility].  -Discontinue Amitriptyline for now given no benefit, start Neurontin in place of this for neuropathic pain -Think that Rexulti should also be discontinued at some point and patient should be tried on Cymbalta 60 mg daily for depression and neuropathy. Antipsychotic is also probably not helping his DM control either. Would consider this change at next visit if his depression symptoms are poorly controlled.

## 2015-09-16 NOTE — Assessment & Plan Note (Signed)
Taking Amitriptyline 75 mg qhs, says this is not helping his foot pain. Wants "percs". Explained to the patient that this is not the ideal therapy for neuropathy. Seemed to be upset by this. -Discontinue Amitriptyline -Start Neurontin 300 mg tid -May benefit from using Cymbalta for eventual treatment of anxiety/depression/neuropathy

## 2015-09-16 NOTE — Assessment & Plan Note (Signed)
Lab Results  Component Value Date   HGBA1C 9.5 09/02/2015     Assessment: Diabetes control:  CBG's improved Comments: Now taking Metformin 1000 mg bid with significant improvement in CBG's.   Plan: Medications:  continue current medications Home glucose monitoring: Frequency:  bid Instruction/counseling given: reminded to bring blood glucose meter & log to each visit, reminded to bring medications to each visit, discussed foot care and discussed diet Other plans: RTC in 6 weeks for follow up

## 2015-09-16 NOTE — Assessment & Plan Note (Signed)
Check urine microalbumin/cr. Foot exam.

## 2015-09-16 NOTE — Assessment & Plan Note (Signed)
Patient interested in treatment, however, currently does not have insurance. Needs to establish Medicaid, orange card, etc before this process can be started.

## 2015-09-19 NOTE — Progress Notes (Signed)
Internal Medicine Clinic Attending  Case discussed with Dr. Jones at the time of the visit.  We reviewed the resident's history and exam and pertinent patient test results.  I agree with the assessment, diagnosis, and plan of care documented in the resident's note.  

## 2015-10-19 ENCOUNTER — Ambulatory Visit: Payer: Self-pay

## 2015-10-27 ENCOUNTER — Telehealth: Payer: Self-pay | Admitting: Internal Medicine

## 2015-10-27 NOTE — Telephone Encounter (Signed)
APPT. REMINDER CALL, NO ANSWER, NO VOICE MAIL °

## 2015-10-28 ENCOUNTER — Encounter: Payer: Self-pay | Admitting: Internal Medicine

## 2015-10-28 ENCOUNTER — Ambulatory Visit: Payer: Self-pay | Admitting: Internal Medicine

## 2016-07-05 ENCOUNTER — Encounter: Payer: Self-pay | Admitting: Pediatric Intensive Care

## 2016-07-08 ENCOUNTER — Encounter: Payer: Self-pay | Admitting: Pediatric Intensive Care

## 2016-07-22 ENCOUNTER — Encounter: Payer: Self-pay | Admitting: Pediatric Intensive Care

## 2016-07-24 NOTE — Congregational Nurse Program (Signed)
Congregational Nurse Program Note  Date of Encounter: 07/08/2016  Past Medical History: Past Medical History:  Diagnosis Date  . Chronic hepatitis C (HCC)   . Depression   . Diabetes mellitus without complication (HCC)   . Diabetic peripheral neuropathy associated with type 2 diabetes mellitus [facility](HCC)     Encounter Details:     CNP Questionnaire - 07/08/16 1230      Patient Demographics   Is this a new or existing patient? Existing   Patient is considered a/an Not Applicable   Race African-American/Black     Patient Assistance   Location of Patient Assistance [facility]   Patient's financial/insurance status Self-Pay (Uninsured)   Uninsured Patient ([employer] Research officer, trade unionCard/Care Connects) No   Patient referred to apply for the following financial assistance Medicaid   Food insecurities addressed Not Applicable   Transportation assistance No   Assistance securing medications No   Educational health offerings Medications;Navigating the healthcare system     Encounter Details   Primary purpose of visit Navigating the Healthcare System;Chronic Illness/Condition Visit   Was an Emergency Department visit averted? Not Applicable   Does patient have a medical provider? No   Patient referred to Not Applicable   Was a mental health screening completed? (GAINS tool) No   Does patient have dental issues? No   Does patient have vision issues? No   Does your patient have an abnormal blood pressure today? No   Since previous encounter, have you referred patient for abnormal blood pressure that resulted in a new diagnosis or medication change? No   Does your patient have an abnormal blood glucose today? No   Since previous encounter, have you referred patient for abnormal blood glucose that resulted in a new diagnosis or medication change? No   Was there a life-saving intervention made? No     Cleint does not have his glucometer with him but states that his AM BGs have been less than 110. Client encouraged  to bring glucometer for next visit. CN assisted client in setting up medication in pill box. Container given so client can keep sharps until able to dispose in CN office.

## 2016-07-24 NOTE — Congregational Nurse Program (Signed)
Congregational Nurse Program Note  Date of Encounter: 07/05/2016  Past Medical History: Past Medical History:  Diagnosis Date  . Chronic hepatitis C (HCC)   . Depression   . Diabetes mellitus without complication (HCC)   . Diabetic peripheral neuropathy associated with type 2 diabetes mellitus (HCC)     Encounter Details:     CNP Questionnaire - 07/05/16 0930      Patient Demographics   Is this a new or existing patient? New   Patient is considered a/an Not Applicable   Race African-American/Black     Patient Assistance   Location of Patient Assistance [facility]   Patient's financial/insurance status Self-Pay (Uninsured)   Uninsured Patient ([employer] officer, trade unionCard/Care Connects) No   Patient referred to apply for the following financial assistance Medicaid   Food insecurities addressed Not Applicable   Transportation assistance No   Assistance securing medications No     Encounter Details   Primary purpose of visit Navigating the Healthcare System;Education/Health Concerns   Was an Emergency Department visit averted? Not Applicable   Does patient have a medical provider? No   Patient referred to Establish PCP   Was a mental health screening completed? (GAINS tool) No   Does patient have dental issues? No   Does patient have vision issues? No   Does your patient have an abnormal blood pressure today? No   Since previous encounter, have you referred patient for abnormal blood pressure that resulted in a new diagnosis or medication change? No   Does your patient have an abnormal blood glucose today? Yes   Since previous encounter, have you referred patient for abnormal blood glucose that resulted in a new diagnosis or medication change? No   Was there a life-saving intervention made? No     [facility] States he was just released from [facility]. States that he as history of diabetes but not hypertension although on medication reconcilliation [facility]  client has a prescription for lisinopril. Client states that he does not test his BGs because they were normal before he left prison and he was told by prison staff that he did not need to test. Glucometer given to client- BG 304 2 hours PC. CN instructed client to test before breakfast for the next week and then review with CN. CN will provide client with pill box and will review medications. Cleitn will need to establish PCP in this area.

## 2016-07-29 ENCOUNTER — Encounter: Payer: Self-pay | Admitting: Pediatric Intensive Care

## 2016-08-02 ENCOUNTER — Encounter: Payer: Self-pay | Admitting: Pediatric Intensive Care

## 2016-08-09 ENCOUNTER — Encounter: Payer: Self-pay | Admitting: Pediatric Intensive Care

## 2016-08-09 NOTE — Congregational Nurse Program (Signed)
Congregational Nurse Program Note  Date of Encounter: 08/09/2016  Past Medical History: Past Medical History:  Diagnosis Date  . Chronic hepatitis C (HCC)   . Depression   . Diabetes mellitus without complication (HCC)   . Diabetic peripheral neuropathy associated with type 2 diabetes mellitus (HCC)     Encounter Details:     CNP Questionnaire - 08/09/16 1020      Patient Demographics   Is this a new or existing patient? Existing   Patient is considered a/an Not Applicable   Race African-American/Black     Patient Assistance   Location of Patient Assistance [facility]   Patient's financial/insurance status Self-Pay (Uninsured)   Uninsured Patient ([employer] officer, trade unionCard/Care Connects) Yes   Interventions Appt. has been completed   Patient referred to apply for the following financial assistance Medicaid;[employer]ange Research officer, trade unionCard/Care Connects   Food insecurities addressed Not Applicable   Transportation assistance No   Assistance securing medications No   Educational health offerings Diabetes;Navigating the healthcare system;Hypertension     Encounter Details   Primary purpose of visit Navigating the Healthcare System;Chronic Illness/Condition Visit;Education/Health Concerns   Was an Emergency Department visit averted? Not Applicable   Does patient have a medical provider? Yes   Patient referred to Not Applicable   Was a mental health screening completed? (GAINS tool) No   Does patient have dental issues? No   Does patient have vision issues? No   Does your patient have an abnormal blood pressure today? No   Since previous encounter, have you referred patient for abnormal blood pressure that resulted in a new diagnosis or medication change? No   Does your patient have an abnormal blood glucose today? No   Since previous encounter, have you referred patient for abnormal blood glucose that resulted in a new diagnosis or medication change? No   Was there a life-saving intervention made? No     Client  in for BP check and to load pill box. Client loaded pill box by himself but still has multiple questions regarding which medications are which. CN counselled client to make certain to read pill bottle as he is loading pills into the day/night slots. Client states that he is having episodes of "seeing black spots" when he goes from sitting to standing. CN will contact client provider regarding this issue. Client will return to CN office on Friday for BP check.

## 2016-08-19 ENCOUNTER — Encounter: Payer: Self-pay | Admitting: Pediatric Intensive Care

## 2016-08-24 ENCOUNTER — Encounter: Payer: Self-pay | Admitting: Internal Medicine

## 2016-08-29 ENCOUNTER — Encounter: Payer: Self-pay | Admitting: Pediatric Intensive Care

## 2016-09-01 NOTE — Congregational Nurse Program (Signed)
Congregational Nurse Program Note  Date of Encounter: 07/22/2016  Past Medical History: Past Medical History:  Diagnosis Date  . Chronic hepatitis C (HCC)   . Depression   . Diabetes mellitus without complication (HCC)   . Diabetic peripheral neuropathy associated with type 2 diabetes mellitus (HCC)     Encounter Details:     CNP Questionnaire - 08/09/16 1020      Patient Demographics   Is this a new or existing patient? Existing   Patient is considered a/an Not Applicable   Race African-American/Black     Patient Assistance   Location of Patient Assistance [facility]   Patient's financial/insurance status Self-Pay (Uninsured)   Uninsured Patient ([employer] Research officer, trade unionCard/Care Connects) Yes   Interventions Appt. has been completed   Patient referred to apply for the following financial assistance Medicaid;[employer]ange Research officer, trade unionCard/Care Connects   Food insecurities addressed Not Applicable   Transportation assistance No   Assistance securing medications No   Educational health offerings Diabetes;Navigating the healthcare system;Hypertension     Encounter Details   Primary purpose of visit Navigating the Healthcare System;Chronic Illness/Condition Visit;Education/Health Concerns   Was an Emergency Department visit averted? Not Applicable   Does patient have a medical provider? Yes   Patient referred to Not Applicable   Was a mental health screening completed? (GAINS tool) No   Does patient have dental issues? No   Does patient have vision issues? No   Does your patient have an abnormal blood pressure today? No   Since previous encounter, have you referred patient for abnormal blood pressure that resulted in a new diagnosis or medication change? No   Does your patient have an abnormal blood glucose today? No   Since previous encounter, have you referred patient for abnormal blood glucose that resulted in a new diagnosis or medication change? No   Was there a life-saving intervention made? No     Client  is out of Metformin. [facility]. Complains of "seeing spots" when he stands up. Will continue to follow BPs in clinic.

## 2016-09-02 ENCOUNTER — Encounter: Payer: Self-pay | Admitting: Pediatric Intensive Care

## 2016-09-03 NOTE — Congregational Nurse Program (Signed)
Congregational Nurse Program Note  Date of Encounter: 08/02/2016  Past Medical History: Past Medical History:  Diagnosis Date  . Chronic hepatitis C (HCC)   . Depression   . Diabetes mellitus without complication (HCC)   . Diabetic peripheral neuropathy associated with type 2 diabetes mellitus (HCC)     Encounter Details:     CNP Questionnaire - 08/09/16 1020      Patient Demographics   Is this a new or existing patient? Existing   Patient is considered a/an Not Applicable   Race African-American/Black     Patient Assistance   Location of Patient Assistance [facility]   Patient's financial/insurance status Self-Pay (Uninsured)   Uninsured Patient ([employer] officer, trade unionCard/Care Connects) Yes   Interventions Appt. has been completed   Patient referred to apply for the following financial assistance Medicaid;[employer] Research officer, trade unionCard/Care Connects   Food insecurities addressed Not Applicable   Transportation assistance No   Assistance securing medications No   Educational health offerings Diabetes;Navigating the healthcare system;Hypertension     Encounter Details   Primary purpose of visit Navigating the Healthcare System;Chronic Illness/Condition Visit;Education/Health Concerns   Was an Emergency Department visit averted? Not Applicable   Does patient have a medical provider? Yes   Patient referred to Not Applicable   Was a mental health screening completed? (GAINS tool) No   Does patient have dental issues? No   Does patient have vision issues? No   Does your patient have an abnormal blood pressure today? No   Since previous encounter, have you referred patient for abnormal blood pressure that resulted in a new diagnosis or medication change? No   Does your patient have an abnormal blood glucose today? No   Since previous encounter, have you referred patient for abnormal blood glucose that resulted in a new diagnosis or medication change? No   Was there a life-saving intervention made? No     BP  check

## 2016-09-03 NOTE — Congregational Nurse Program (Signed)
Congregational Nurse Program Note  Date of Encounter: 07/29/2016  Past Medical History: Past Medical History:  Diagnosis Date  . Chronic hepatitis C (HCC)   . Depression   . Diabetes mellitus without complication (HCC)   . Diabetic peripheral neuropathy associated with type 2 diabetes mellitus (HCC)     Encounter Details:     CNP Questionnaire - 08/09/16 1020      Patient Demographics   Is this a new or existing patient? Existing   Patient is considered a/an Not Applicable   Race African-American/Black     Patient Assistance   Location of Patient Assistance [facility]   Patient's financial/insurance status Self-Pay (Uninsured)   Uninsured Patient ([employer] officer, trade unionCard/Care Connects) Yes   Interventions Appt. has been completed   Patient referred to apply for the following financial assistance Medicaid;[employer] Research officer, trade unionCard/Care Connects   Food insecurities addressed Not Applicable   Transportation assistance No   Assistance securing medications No   Educational health offerings Diabetes;Navigating the healthcare system;Hypertension     Encounter Details   Primary purpose of visit Navigating the Healthcare System;Chronic Illness/Condition Visit;Education/Health Concerns   Was an Emergency Department visit averted? Not Applicable   Does patient have a medical provider? Yes   Patient referred to Not Applicable   Was a mental health screening completed? (GAINS tool) No   Does patient have dental issues? No   Does patient have vision issues? No   Does your patient have an abnormal blood pressure today? No   Since previous encounter, have you referred patient for abnormal blood pressure that resulted in a new diagnosis or medication change? No   Does your patient have an abnormal blood glucose today? No   Since previous encounter, have you referred patient for abnormal blood glucose that resulted in a new diagnosis or medication change? No   Was there a life-saving intervention made? No      Medication review and set up. Client still having difficulty identifying medication and placing in correct day. Reviewed going from sitting to standing and giving time to adjust.

## 2016-09-15 NOTE — Congregational Nurse Program (Signed)
Congregational Nurse Program Note  Date of Encounter: 08/19/2016  Past Medical History: Past Medical History:  Diagnosis Date  . Chronic hepatitis C (HCC)   . Depression   . Diabetes mellitus without complication (HCC)   . Diabetic peripheral neuropathy associated with type 2 diabetes mellitus [facility](HCC)     Encounter Details:     CNP Questionnaire - 08/19/16 1030      Patient Demographics   Is this a new or existing patient? Existing   Patient is considered a/an Not Applicable   Race African-American/Black     Patient Assistance   Location of Patient Assistance [facility]   Patient's financial/insurance status Self-Pay (Uninsured);Low Income   Uninsured Patient ([employer] officer, trade unionCard/Care Connects) Yes   Interventions Appt. has been completed   Patient referred to apply for the following financial assistance [employer] Card/Care Connects;Medicaid   Food insecurities addressed Not Applicable   Transportation assistance No   Type of Assistance Other   Assistance securing medications Yes   Type of Assistance Other   Educational health offerings Hypertension;Medications;Navigating the healthcare system     Encounter Details   Primary purpose of visit Navigating the Healthcare System;Chronic Illness/Condition Visit   Was an Emergency Department visit averted? Not Applicable   Does patient have a medical provider? Yes   Patient referred to Follow up with established PCP   Was a mental health screening completed? (GAINS tool) No   Does patient have dental issues? No   Does patient have vision issues? No   Does your patient have an abnormal blood pressure today? No   Since previous encounter, have you referred patient for abnormal blood pressure that resulted in a new diagnosis or medication change? No   Does your patient have an abnormal blood glucose today? No   Since previous encounter, have you referred patient for abnormal blood glucose that resulted in a new diagnosis or medication change? No   Was there a life-saving intervention made? No     BP check. Client states that dizzy spells are improved. Orthostatic BPs taken. CN advised client to stand slowly and survey surroundings before ambulating.

## 2016-09-15 NOTE — Congregational Nurse Program (Signed)
Congregational Nurse Program Note  Date of Encounter: 08/29/2016  Past Medical History: Past Medical History:  Diagnosis Date  . Chronic hepatitis C (HCC)   . Depression   . Diabetes mellitus without complication (HCC)   . Diabetic peripheral neuropathy associated with type 2 diabetes mellitus (HCC)     Encounter Details:     CNP Questionnaire - 08/29/16 0900      Patient Demographics   Is this a new or existing patient? Existing   Patient is considered a/an Not Applicable   Race African-American/Black     Patient Assistance   Location of Patient Assistance [facility]   Patient's financial/insurance status Self-Pay (Uninsured);Low Income   Uninsured Patient ([employer] officer, trade unionCard/Care Connects) Yes   Interventions Appt. has been completed   Patient referred to apply for the following financial assistance [employer] Card/Care Connects   Food insecurities addressed Not Applicable   Transportation assistance No   Type of Assistance Other   Assistance securing medications Yes   Type of Holiday representativeAssistance [facility]   Educational health offerings Hypertension;Medications;Navigating the healthcare system     Encounter Details   Primary purpose of visit Navigating the Healthcare System   Was an Emergency Department visit averted? Not Applicable   Does patient have a medical provider? Yes   Patient referred to Follow up with established PCP   Was a mental health screening completed? (GAINS tool) No   Does patient have dental issues? No   Does patient have vision issues? No   Does your patient have an abnormal blood pressure today? No   Since previous encounter, have you referred patient for abnormal blood pressure that resulted in a new diagnosis or medication change? No   Does your patient have an abnormal blood glucose today? No   Since previous encounter, have you referred patient for abnormal blood glucose that resulted in a new diagnosis or medication change? No   Was there a life-saving  intervention made? No     BP check and medication assistance. Client needs fluocinocide. CN will look into sources for medication.

## 2016-09-18 NOTE — Congregational Nurse Program (Signed)
Congregational Nurse Program Note  Date of Encounter: 09/02/2016  Past Medical History: Past Medical History:  Diagnosis Date  . Chronic hepatitis C (HCC)   . Depression   . Diabetes mellitus without complication (HCC)   . Diabetic peripheral neuropathy associated with type 2 diabetes mellitus [facility](HCC)     Encounter Details:     CNP Questionnaire - 09/02/16 1030      Patient Demographics   Is this a new or existing patient? Existing   Patient is considered a/an Not Applicable   Race African-American/Black     Patient Assistance   Location of Patient Assistance [facility]   Patient's financial/insurance status Self-Pay (Uninsured);Low Income   Uninsured Patient ([employer] Research officer, trade unionCard/Care Connects) Yes   Interventions Not Applicable   Patient referred to apply for the following financial assistance [employer]/Care Connects   Food insecurities addressed Not Applicable   Transportation assistance No   Type of Assistance Other   Assistance securing medications Yes   Type of Holiday representativeAssistance [facility]   Educational health offerings Medications;Navigating the healthcare system     Encounter Details   Primary purpose of visit Navigating the Healthcare System   Was an Emergency Department visit averted? Not Applicable   Does patient have a medical provider? Yes   Patient referred to Follow up with established PCP   Was a mental health screening completed? (GAINS tool) No   Does patient have dental issues? No   Does patient have vision issues? No   Does your patient have an abnormal blood pressure today? No   Since previous encounter, have you referred patient for abnormal blood pressure that resulted in a new diagnosis or medication change? No   Does your patient have an abnormal blood glucose today? No   Since previous encounter, have you referred patient for abnormal blood glucose that resulted in a new diagnosis or medication change? No   Was there a life-saving intervention made? No       Client is low on Amytriptilline. CN [facility]

## 2016-09-20 ENCOUNTER — Encounter: Payer: Self-pay | Admitting: Pediatric Intensive Care

## 2016-09-23 ENCOUNTER — Encounter: Payer: Self-pay | Admitting: Internal Medicine

## 2016-09-26 ENCOUNTER — Encounter: Payer: Self-pay | Admitting: Pediatric Intensive Care

## 2016-10-04 ENCOUNTER — Encounter: Payer: Self-pay | Admitting: Pediatric Intensive Care

## 2016-10-11 ENCOUNTER — Encounter: Payer: Self-pay | Admitting: Family Medicine

## 2016-10-11 ENCOUNTER — Ambulatory Visit (INDEPENDENT_AMBULATORY_CARE_PROVIDER_SITE_OTHER): Payer: Self-pay | Admitting: Family Medicine

## 2016-10-11 VITALS — BP 140/72 | HR 81 | Temp 98.6°F | Ht 70.0 in | Wt 175.0 lb

## 2016-10-11 DIAGNOSIS — G629 Polyneuropathy, unspecified: Secondary | ICD-10-CM

## 2016-10-11 DIAGNOSIS — E1165 Type 2 diabetes mellitus with hyperglycemia: Secondary | ICD-10-CM

## 2016-10-11 DIAGNOSIS — F418 Other specified anxiety disorders: Secondary | ICD-10-CM

## 2016-10-11 DIAGNOSIS — B182 Chronic viral hepatitis C: Secondary | ICD-10-CM

## 2016-10-11 DIAGNOSIS — R030 Elevated blood-pressure reading, without diagnosis of hypertension: Secondary | ICD-10-CM

## 2016-10-11 DIAGNOSIS — IMO0001 Reserved for inherently not codable concepts without codable children: Secondary | ICD-10-CM

## 2016-10-11 LAB — POCT URINALYSIS DIP (DEVICE)
Bilirubin Urine: NEGATIVE
Glucose, UA: 500 mg/dL — AB
Ketones, ur: NEGATIVE mg/dL
Leukocytes, UA: NEGATIVE
Nitrite: NEGATIVE
PH: 7 (ref 5.0–8.0)
Protein, ur: >=300 mg/dL — AB
Specific Gravity, Urine: 1.025 (ref 1.005–1.030)
UROBILINOGEN UA: 1 mg/dL (ref 0.0–1.0)

## 2016-10-11 LAB — POCT GLYCOSYLATED HEMOGLOBIN (HGB A1C): Hemoglobin A1C: 6.4

## 2016-10-11 MED ORDER — SAXAGLIPTIN HCL 2.5 MG PO TABS: 2.5000 mg | ORAL_TABLET | Freq: Every day | ORAL | 1 refills | Status: DC

## 2016-10-11 MED ORDER — BLOOD GLUCOSE MONITOR KIT: PACK | 0 refills | Status: DC

## 2016-10-11 MED ORDER — METFORMIN HCL 1000 MG PO TABS: 1000.0000 mg | ORAL_TABLET | Freq: Two times a day (BID) | ORAL | 2 refills | Status: DC

## 2016-10-11 MED ORDER — GLUCOSE BLOOD VI STRP: ORAL_STRIP | 12 refills | Status: DC

## 2016-10-11 MED ORDER — GABAPENTIN 300 MG PO CAPS: ORAL_CAPSULE | ORAL | 5 refills | Status: DC

## 2016-10-11 NOTE — Patient Instructions (Addendum)
Diabetes Resume metformin 1000 mg twice daily with food.  Check blood sugar once in the morning and 2 hours after eating in the evening. Keep a log of readings and bring back with you to your next appointment.   Neuropathy Pain Take Gabapentin 300 mg once with breakfast and 600 mg after dinner prior to bedtime. Return in 1 month, fasting, for a complete physical exam and screening labs.    Blood Glucose Monitoring, Adult Monitoring your blood sugar (glucose) helps you manage your diabetes. It also helps you and your health care provider determine how well your diabetes management plan is working. Blood glucose monitoring involves checking your blood glucose as often as directed, and keeping a record (log) of your results over time. Why should I monitor my blood glucose? Checking your blood glucose regularly can:  Help you understand how food, exercise, illnesses, and medicines affect your blood glucose.  Let you know what your blood glucose is at any time. You can quickly tell if you are having low blood glucose (hypoglycemia) or high blood glucose (hyperglycemia).  Help you and your health care provider adjust your medicines as needed. When should I check my blood glucose? Follow instructions from your health care provider about how often to check your blood glucose. This may depend on:  The type of diabetes you have.  How well-controlled your diabetes is.  Medicines you are taking.    If you have a history of severe hypoglycemia.  If you have a history of not knowing when your blood glucose is getting low (hypoglycemia unawareness). If you have type 2 diabetes:   If you take insulin or other diabetes medicines, check your blood glucose at least 2 times a day.  If you are on intensive insulin therapy, check your blood glucose at least 4 times a day. Occasionally, you may also need to check between 2:00 a.m. and 3:00 a.m., as directed.  Also check your blood  glucose:  Before and after exercise.  Before potentially dangerous tasks, like driving or using heavy machinery.  You may need to check your blood glucose more often if:  Your medicine is being adjusted.  Your diabetes is not well-controlled.  You are ill. What is a blood glucose log?  A blood glucose log is a record of your blood glucose readings. It helps you and your health care provider:  Look for patterns in your blood glucose over time.  Adjust your diabetes management plan as needed.  Every time you check your blood glucose, write down your result and notes about things that may be affecting your blood glucose, such as your diet and exercise for the day.  Most glucose meters store a record of glucose readings in the meter. Some meters allow you to download your records to a computer. How do I check my blood glucose? Follow these steps to get accurate readings of your blood glucose: Supplies needed    Blood glucose meter.  Test strips for your meter. Each meter has its own strips. You must use the strips that come with your meter.  A needle to prick your finger (lancet). Do not use lancets more than once.  A device that holds the lancet (lancing device).  A journal or log book to write down your results. Procedure   Wash your hands with soap and water.  Prick the side of your finger (not the tip) with the lancet. Use a different finger each time.  Gently rub the finger until a  small drop of blood appears.  Follow instructions that come with your meter for inserting the test strip, applying blood to the strip, and using your blood glucose meter.  Write down your result and any notes. Alternative testing sites   Some meters allow you to use areas of your body other than your finger (alternative sites) to test your blood.  If you think you may have hypoglycemia, or if you have hypoglycemia unawareness, do not use alternative sites. Use your finger  instead.  Alternative sites may not be as accurate as the fingers, because blood flow is slower in these areas. This means that the result you get may be delayed, and it may be different from the result that you would get from your finger.  The most common alternative sites are:  Forearm.  Thigh.  Palm of the hand. Additional tips   Always keep your supplies with you.  If you have questions or need help, all blood glucose meters have a 24-hour "hotline" number that you can call. You may also contact your health care provider.  After you use a few boxes of test strips, adjust (calibrate) your blood glucose meter by following instructions that came with your meter. This information is not intended to replace advice given to you by your health care provider. Make sure you discuss any questions you have with your health care provider. Document Released: 07/07/2003 Document Revised: 01/22/2016 Document Reviewed: 12/14/2015 Elsevier Interactive Patient Education  2017 [employer].

## 2016-10-11 NOTE — Progress Notes (Signed)
Patient ID: William Ware, male    DOB: 1956-06-13, 61 y.o.   MRN: [id number]  PCP: Joaquin CourtsKimberly Jasma Seevers, FNP  Chief Complaint  Patient presents with  . Establish Care    paperwork for SSI    Subjective:  HPI  William Ware is a 61 y.o. male presents to establish care today.  William Ware is accompanied by his case worker today at this visit. He is a former substance abuse user, has been incarcerated intermittently for most of his adult life. William Ware is a current participant in a program referred to as [facility]" which helps individuals from vunerable population acclimate to new housing, healthcare, and obtain financial resources. His most recent release from prison was December 2017.  The medical history noted today were documented from medical records retrieved during his incarceration and provided today by patient's social worker. William Ware reports a hx of diabetes with neuropathy, dx in 2017. He take metformin 1,000 mg 2 times daily with meals. He had recently began self monitoring blood glucose and reports readings within the range of 125-135 fasting. Uncertain of the last time he has checked glucose, he admits that once his readings were "good" he stopped checking. Reports a long time hx of burning with numbness of feet. He also notes that feet are continually cold. This makes him feel irritated and annoyed as the pain interferes with sleep. Currently prescribed Gabapentin 300 mg three times daily for neuropathy.  "I have people helping me". William Ware reports it has been sometime since he has taken any medication for anxiety or depression and feels this is not needed at present.  Other medical hx noted in social worker's documentation includes: Positive Hep C screening w/o treatment,  chronic worsening memory loss, arthritis, impaired mobility which requires use of a cane-(max independent ambulation is 50 feet max).  Social History   Social History  . Marital status: Widowed   Spouse name: N/A  . Number of children: N/A  . Years of education: N/A   Occupational History  . Not on file.   Social History Main Topics  . Smoking status: Current Some Day Smoker    Types: Cigarettes    Start date: 08/18/1970  . Smokeless tobacco: Never Used     Comment: 1-2 per day  . Alcohol use 0.0 oz/week  . Drug use: Yes    Types: Cocaine     Comment: heroin  . Sexual activity: Not on file   Other Topics Concern  . Not on file   Social History Narrative  . No narrative on file    History reviewed. No pertinent family history.   Review of Systems  Patient Active Problem List   Diagnosis Date Noted  . Diabetic polyneuropathy (HCC) 09/15/2015  . Diabetes mellitus type II, uncontrolled (HCC) 09/02/2015  . Depression with anxiety 09/02/2015  . Hep C w/o coma, chronic (HCC) 09/02/2015  . 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 Healthcare maintenance 09/02/2015  . Imprisonment and other incarceration 09/02/2015    No Known Allergies  Prior to Admission medications   Medication Sig Start Date End Date Taking? Authorizing Provider  Brexpiprazole (REXULTI) 1 MG TABS Take 1 mg by mouth daily. 09/16/15  Yes Courtney ParisEden W Jones, MD  metFORMIN (GLUCOPHAGE) 1000 MG tablet Take 1 tablet (1,000 mg total) by mouth 2 (two) times daily with a meal. 09/02/15  Yes Courtney ParisEden W Jones, MD  gabapentin (NEURONTIN) 300 MG capsule Take 1 capsule (300 mg  total) by mouth 3 (three) times daily. 09/15/15 09/14/16  Courtney Paris, MD    Past Medical, Surgical Family and Social History reviewed and updated.    Objective:   Today's Vitals   10/11/16 1024  BP: 140/72  Pulse: 81  Temp: 98.6 F (37 C)  TempSrc: Oral  SpO2: 100%  Weight: 175 lb (79.4 kg)  Height: 5\' 10"  (1.778 m)    Wt Readings from Last 3 Encounters:  10/11/16 175 lb (79.4 kg)  09/15/15 168 lb 4.8 oz (76.3 kg)  09/02/15 171 lb 6.4 oz (77.7 kg)   Physical Exam  Constitutional: He appears well-developed  and well-nourished.  HENT:  Head: Normocephalic and atraumatic.  Right Ear: External ear normal.  Left Ear: External ear normal.  Mouth/Throat: Oropharynx is clear and moist.  Eyes: Conjunctivae and EOM are normal. Pupils are equal, round, and reactive to light.  Neck: Normal range of motion. Neck supple.  Cardiovascular: Normal rate, regular rhythm, normal heart sounds and intact distal pulses.   Pulmonary/Chest: Effort normal and breath sounds normal.  Skin: Skin is warm and dry.  Psychiatric: He has a normal mood and affect. His behavior is normal. Judgment and thought content normal. His speech is delayed. Cognition and memory are impaired.   Difficulty and delays in recalling distant memories      Assessment & Plan:  Today's visit was to establish care and review William Ware's extensive medical history. I have complete documentation of his general medical assessment for SSI/Disability which should expedite patient receiving his healthcare coverage. For this reason, I will defer ordering further diagnostic labs until William Ware's follow-up in 1 month.   I will obtain a recent A1C today to obtain a baseline and ensure that diabetes is controlled.  1. Uncontrolled type 2 diabetes mellitus without complication, without long-term current use of insulin (HCC) A1C-hemoglobin 6.4 today, well-controlled Continue Metformin 1000 mg twice daily with meals.  2. Hep C w/o coma, chronic (HCC) -Once insured will perform a confirmatory test and consider referral to infectious disease. One positive Hep C antibody noted in EMR although confirmatory test is not viewable within EMR.  3. Neuropathy (HCC) -Continue Gabapentin as follows 300 mg in morning and 600 mg before bedtime.  4. Elevated blood-pressure reading without diagnosis of hypertension -Hypertension-continue monitoring for now  5. Depression and Anxiety  -Depression and anxiety- stable for now. He has not been taking any antidepressants  and chooses to remain off medication at present.  Godfrey Pick. Tiburcio Pea, MSN, [facility] [address] [place], [place] [address] [phone]

## 2016-10-17 ENCOUNTER — Encounter: Payer: Self-pay | Admitting: Pediatric Intensive Care

## 2016-10-18 ENCOUNTER — Encounter: Payer: Self-pay | Admitting: Pediatric Intensive Care

## 2016-10-20 NOTE — Congregational Nurse Program (Signed)
Congregational Nurse Program Note  Date of Encounter: 09/20/2016  Past Medical History: Past Medical History:  Diagnosis Date  . Chronic hepatitis C (HCC)   . Depression   . Diabetes mellitus without complication (HCC)   . Diabetic peripheral neuropathy associated with type 2 diabetes mellitus (HCC)     Encounter Details: BP check. Working with client to be more independent regarding organizing medications. Assisted client with picking up medication from [facility].

## 2016-10-21 ENCOUNTER — Encounter: Payer: Self-pay | Admitting: Pediatric Intensive Care

## 2016-10-22 NOTE — Congregational Nurse Program (Signed)
Congregational Nurse Program Note  Date of Encounter: 09/26/2016  Past Medical History: Past Medical History:  Diagnosis Date  . Chronic hepatitis C (HCC)   . Depression   . Diabetes mellitus without complication (HCC)   . Diabetic peripheral neuropathy associated with type 2 diabetes mellitus (HCC)     Encounter Details:     CNP Questionnaire - 09/26/16 0900      Patient Demographics   Is this a new or existing patient? Existing   Patient is considered a/an Not Applicable   Race African-American/Black     Patient Assistance   Location of Patient Assistance [facility]   Patient's financial/insurance status [employer], trade union   Uninsured Patient ([employer] Card/Care Connects) Yes   Interventions Follow-up/Education/Support provided after completed appt.   Patient referred to apply for the following financial assistance Medicaid   Food insecurities addressed Not Applicable   Transportation assistance No   Type of Assistance Other   Assistance securing medications Yes   Type of Assistance [facility]   Educational health offerings Medications;Navigating the healthcare system     Encounter Details   Primary purpose of visit Navigating the Healthcare System;Education/Health Concerns   Was an Emergency Department visit averted? Not Applicable   Does patient have a medical provider? Yes   Patient referred to Follow up with established PCP   Was a mental health screening completed? (GAINS tool) No   Does patient have dental issues? Yes   Was a dental referral made? Yes   Does patient have vision issues? No   Does your patient have an abnormal blood pressure today? No   Since previous encounter, have you referred patient for abnormal blood pressure that resulted in a new diagnosis or medication change? No   Does your patient have an abnormal blood glucose today? No   Since previous encounter, have you referred patient for abnormal blood glucose that resulted  in a new diagnosis or medication change? No   Was there a life-saving intervention made? No     Needs assistance obtaining medication fro [facility]. Needs assistance with pill organizer. Client on list for dental referral for clinic this month.

## 2016-10-23 NOTE — Congregational Nurse Program (Signed)
Congregational Nurse Program Note  Date of Encounter: 10/04/2016  Past Medical History: Past Medical History:  Diagnosis Date  . Chronic hepatitis C (HCC)   . Depression   . Diabetes mellitus without complication (HCC)   . Diabetic peripheral neuropathy associated with type 2 diabetes mellitus (HCC)     Encounter Details:     CNP Questionnaire - 10/04/16 0830      Patient Demographics   Is this a new or existing patient? Existing   Patient is considered a/an Not Applicable   Race African-American/Black     Patient Assistance   Location of Patient Assistance [facility]   Patient's financial/insurance status [employer], trade union   Uninsured Patient ([employer]/Care Connects) Yes   Interventions Not Applicable   Patient referred to apply for the following financial assistance Medicaid   Food insecurities addressed Not Applicable   Transportation assistance No   Type of Assistance Other   Assistance securing medications No   Type of Pharmacist, hospital the healthcare system;Diabetes;Hypertension     Encounter Details   Primary purpose of visit Education/Health Concerns   Was an Emergency Department visit averted? Not Applicable   Does patient have a medical provider? Yes   Patient referred to Follow up with established PCP   Was a mental health screening completed? (GAINS tool) No   Does patient have dental issues? Yes   Was a dental referral made? Yes   Does patient have vision issues? No   Does your patient have an abnormal blood pressure today? No   Since previous encounter, have you referred patient for abnormal blood pressure that resulted in a new diagnosis or medication change? No   Does your patient have an abnormal blood glucose today? No   Since previous encounter, have you referred patient for abnormal blood glucose that resulted in a new diagnosis or medication change? No     BP/medication check

## 2016-10-25 ENCOUNTER — Encounter: Payer: Self-pay | Admitting: Pediatric Intensive Care

## 2016-10-28 ENCOUNTER — Encounter: Payer: Self-pay | Admitting: Pediatric Intensive Care

## 2016-11-07 NOTE — Congregational Nurse Program (Signed)
Congregational Nurse Program Note  Date of Encounter: 10/18/2016  Past Medical History: Past Medical History:  Diagnosis Date  . Chronic hepatitis C (HCC)   . Depression   . Diabetes mellitus without complication (HCC)   . Diabetic peripheral neuropathy associated with type 2 diabetes mellitus (HCC)     Encounter Details:     CNP Questionnaire - 10/18/16 1000      Patient Demographics   Is this a new or existing patient? Existing   Patient is considered a/an Not Applicable   Race African-American/Black     Patient Assistance   Location of Patient Assistance [facility]   Patient's financial/insurance status [employer] officer, trade union   Uninsured Patient ([employer] Connects) Yes   Interventions Follow-up/Education/Support provided after completed appt.   Patient referred to apply for the following financial assistance Not Applicable   Food insecurities addressed Not Applicable   Transportation assistance No   Type of Assistance Other   Assistance securing medications No   Educational health offerings Chronic disease     Encounter Details   Primary purpose of visit Chronic Illness/Condition Visit   Was an Emergency Department visit averted? Not Applicable   Does patient have a medical provider? Yes   Patient referred to Follow up with established PCP   Was a mental health screening completed? (GAINS tool) No   Does patient have dental issues? Yes   Was a dental referral made? No   Does patient have vision issues? No   Does your patient have an abnormal blood pressure today? No   Since previous encounter, have you referred patient for abnormal blood pressure that resulted in a new diagnosis or medication change? No   Does your patient have an abnormal blood glucose today? No   Since previous encounter, have you referred patient for abnormal blood glucose that resulted in a new diagnosis or medication change? No   Was there a life-saving intervention made? No     BP  check. Client has been seen at Internal Medicine for disability screening. States his A1C was good. Has follow up with [facility] clinic on 4/12.

## 2016-11-07 NOTE — Congregational Nurse Program (Signed)
Congregational Nurse Program Note  Date of Encounter: 10/25/2016  Past Medical History: Past Medical History:  Diagnosis Date  . Chronic hepatitis C (HCC)   . Depression   . Diabetes mellitus without complication (HCC)   . Diabetic peripheral neuropathy associated with type 2 diabetes mellitus (HCC)     Encounter Details:     CNP Questionnaire - 10/25/16 0900      Patient Demographics   Is this a new or existing patient? Existing   Patient is considered a/an Not Applicable   Race African-American/Black     Patient Assistance   Location of Patient Assistance [facility]   Patient's financial/insurance status [employer] officer, trade union   Uninsured Patient ([employer] Card/Care Connects) Yes   Interventions Not Applicable   Patient referred to apply for the following financial assistance Not Applicable   Food insecurities addressed Not Applicable   Transportation assistance No   Type of Assistance Other   Assistance securing medications No   Type of Assistance Other   Educational health offerings Not Applicable     Encounter Details   Primary purpose of visit Other;Post ED/Hospitalization Visit   Was an Emergency Department visit averted? Not Applicable   Does patient have a medical provider? Yes   Patient referred to Not Applicable   Was a mental health screening completed? (GAINS tool) No   Does patient have dental issues? Yes   Was a dental referral made? No   Does patient have vision issues? No   Does your patient have an abnormal blood pressure today? Yes   Since previous encounter, have you referred patient for abnormal blood pressure that resulted in a new diagnosis or medication change? No   Does your patient have an abnormal blood glucose today? No   Since previous encounter, have you referred patient for abnormal blood glucose that resulted in a new diagnosis or medication change? No   Was there a life-saving intervention made? No     TB test placed in left forearm,  intradermal .1ml. CN will read on Friday morning.

## 2016-11-07 NOTE — Congregational Nurse Program (Signed)
Congregational Nurse Program Note  Date of Encounter: 10/17/2016  Past Medical History: Past Medical History:  Diagnosis Date  . Chronic hepatitis C (HCC)   . Depression   . Diabetes mellitus without complication (HCC)   . Diabetic peripheral neuropathy associated with type 2 diabetes mellitus (HCC)     Encounter Details:     CNP Questionnaire - 10/17/16 0930      Patient Demographics   Is this a new or existing patient? New   Patient is considered a/an Not Applicable   Race African-American/Black     Patient Assistance   Location of Patient Assistance [facility]   Patient's financial/insurance status [employer] Connects   Uninsured Patient ([employer] Card/Care Connects) Yes   Interventions Follow-up/Education/Support provided after completed appt.   Patient referred to apply for the following financial assistance Medicaid   Food insecurities addressed Not Applicable   Transportation assistance No   Type of Assistance Other   Assistance securing medications No   Type of Assistance Other   Educational health offerings Navigating the healthcare system;Medications     Encounter Details   Primary purpose of visit Education/Health Concerns   Was an Emergency Department visit averted? Not Applicable   Does patient have a medical provider? Yes   Patient referred to Not Applicable   Was a mental health screening completed? (GAINS tool) No   Does patient have dental issues? No   Was a dental referral made? No   Does patient have vision issues? No   Does your patient have an abnormal blood pressure today? No   Since previous encounter, have you referred patient for abnormal blood pressure that resulted in a new diagnosis or medication change? No   Does your patient have an abnormal blood glucose today? Yes   Since previous encounter, have you referred patient for abnormal blood glucose that resulted in a new diagnosis or medication change? No   Was there a life-saving intervention  made? No     BP check. Continues to feel dizzy at intervals. C/O increased foot pain from neuropathy. States will discuss thiese issues with next PCP appointment.

## 2016-11-07 NOTE — Congregational Nurse Program (Signed)
Congregational Nurse Program Note  Date of Encounter: 10/21/2016  Past Medical History: Past Medical History:  Diagnosis Date  . Chronic hepatitis C (HCC)   . Depression   . Diabetes mellitus without complication (HCC)   . Diabetic peripheral neuropathy associated with type 2 diabetes mellitus (HCC)     Encounter Details:     CNP Questionnaire - 10/21/16 1000      Patient Demographics   Is this a new or existing patient? New   Patient is considered a/an Not Applicable   Race African-American/Black     Patient Assistance   Location of Patient Assistance [facility]   Patient's financial/insurance status [employer] officer, trade union   Uninsured Patient ([employer] Card/Care Connects) Yes   Interventions Not Applicable   Patient referred to apply for the following financial assistance Not Applicable   Food insecurities addressed Not Applicable   Transportation assistance No   Type of Assistance Other   Assistance securing medications No   Type of Assistance Other   Educational health offerings Not Applicable     Encounter Details   Primary purpose of visit Chronic Illness/Condition Visit   Was an Emergency Department visit averted? Not Applicable   Does patient have a medical provider? Yes   Patient referred to Follow up with established PCP   Was a mental health screening completed? (GAINS tool) No   Does patient have dental issues? Yes   Was a dental referral made? No   Does patient have vision issues? No   Does your patient have an abnormal blood pressure today? No   Since previous encounter, have you referred patient for abnormal blood pressure that resulted in a new diagnosis or medication change? No   Does your patient have an abnormal blood glucose today? No   Since previous encounter, have you referred patient for abnormal blood glucose that resulted in a new diagnosis or medication change? No   Was there a life-saving intervention made? No     Client needs prescription for  fluocininonide ointment.

## 2016-11-11 ENCOUNTER — Encounter: Payer: Self-pay | Admitting: Family Medicine

## 2016-11-11 ENCOUNTER — Ambulatory Visit (INDEPENDENT_AMBULATORY_CARE_PROVIDER_SITE_OTHER): Payer: No Typology Code available for payment source | Admitting: Family Medicine

## 2016-11-11 VITALS — BP 128/78 | HR 85 | Temp 98.6°F | Resp 16 | Ht 70.0 in | Wt 177.0 lb

## 2016-11-11 DIAGNOSIS — G629 Polyneuropathy, unspecified: Secondary | ICD-10-CM

## 2016-11-11 DIAGNOSIS — R800 Isolated proteinuria: Secondary | ICD-10-CM

## 2016-11-11 DIAGNOSIS — E119 Type 2 diabetes mellitus without complications: Secondary | ICD-10-CM

## 2016-11-11 DIAGNOSIS — R809 Proteinuria, unspecified: Secondary | ICD-10-CM | POA: Insufficient documentation

## 2016-11-11 LAB — CBC WITH DIFFERENTIAL/PLATELET
BASOS ABS: 39 {cells}/uL (ref 0–200)
Basophils Relative: 1 %
Eosinophils Absolute: 156 cells/uL (ref 15–500)
Eosinophils Relative: 4 %
HEMATOCRIT: 34.9 % — AB (ref 38.5–50.0)
Hemoglobin: 11.5 g/dL — ABNORMAL LOW (ref 13.2–17.1)
LYMPHS ABS: 1404 {cells}/uL (ref 850–3900)
Lymphocytes Relative: 36 %
MCH: 31.3 pg (ref 27.0–33.0)
MCHC: 33 g/dL (ref 32.0–36.0)
MCV: 94.8 fL (ref 80.0–100.0)
MONO ABS: 312 {cells}/uL (ref 200–950)
MPV: 9.2 fL (ref 7.5–12.5)
Monocytes Relative: 8 %
NEUTROS PCT: 51 %
Neutro Abs: 1989 cells/uL (ref 1500–7800)
Platelets: 269 10*3/uL (ref 140–400)
RBC: 3.68 MIL/uL — ABNORMAL LOW (ref 4.20–5.80)
RDW: 14.3 % (ref 11.0–15.0)
WBC: 3.9 10*3/uL (ref 3.8–10.8)

## 2016-11-11 LAB — POCT URINALYSIS DIP (DEVICE)
Bilirubin Urine: NEGATIVE
GLUCOSE, UA: 500 mg/dL — AB
Leukocytes, UA: NEGATIVE
NITRITE: NEGATIVE
Specific Gravity, Urine: >=1.03 (ref 1.005–1.030)
UROBILINOGEN UA: 0.2 mg/dL (ref 0.0–1.0)
pH: 5.5 (ref 5.0–8.0)

## 2016-11-11 LAB — COMPLETE METABOLIC PANEL WITH GFR
AG Ratio: 1.1 Ratio (ref 1.0–2.5)
ALK PHOS: 44 U/L (ref 40–115)
ALT: 24 U/L (ref 9–46)
AST: 28 U/L (ref 10–35)
Albumin: 3.9 g/dL (ref 3.6–5.1)
BUN/Creatinine Ratio: 11.7 Ratio (ref 6–22)
BUN: 12 mg/dL (ref 7–25)
CHLORIDE: 105 mmol/L (ref 98–110)
CO2: 22 mmol/L (ref 20–31)
Calcium: 9.1 mg/dL (ref 8.6–10.3)
Creat: 1.03 mg/dL (ref 0.70–1.33)
GFR, Est African American: >89 mL/min (ref >=60)
GFR, Est Non African American: 79 mL/min (ref >=60)
GLUCOSE: 151 mg/dL — AB (ref 65–99)
Globulin: 3.6 g/dL (ref 1.9–3.7)
Potassium: 4.3 mmol/L (ref 3.5–5.3)
Sodium: 139 mmol/L (ref 135–146)
TOTAL PROTEIN: 7.5 g/dL (ref 6.1–8.1)
Total Bilirubin: 0.2 mg/dL (ref 0.2–1.2)

## 2016-11-11 LAB — LIPID PANEL
CHOL/HDL RATIO: 3.6 ratio (ref <5.0)
CHOLESTEROL: 133 mg/dL (ref <200)
HDL: 37 mg/dL — ABNORMAL LOW (ref >40)
LDL Cholesterol: 74 mg/dL (ref <100)
TRIGLYCERIDES: 111 mg/dL (ref <150)
VLDL: 22 mg/dL (ref <30)

## 2016-11-11 LAB — GLUCOSE, CAPILLARY: Glucose-Capillary: 139 mg/dL — ABNORMAL HIGH (ref 65–99)

## 2016-11-11 MED ORDER — DULOXETINE HCL 60 MG PO CPEP: 60.0000 mg | ORAL_CAPSULE | Freq: Every day | ORAL | 3 refills | Status: DC

## 2016-11-11 MED ORDER — FLUOCINONIDE-E 0.05 % EX CREA: 1.0000 "application " | TOPICAL_CREAM | Freq: Two times a day (BID) | CUTANEOUS | 2 refills | Status: DC

## 2016-11-11 MED FILL — FLUOCINONIDE-E 0.05% CREAM: 0.05 | 30 days supply | Qty: 60 | Fill #0

## 2016-11-11 MED FILL — DULoxetine HCL 60 MG CPEP: 60 | 30 days supply | Qty: 30 | Fill #0

## 2016-11-11 NOTE — Patient Instructions (Signed)
Continue all medications as prescribed. I will refer you to dermatology and podiatry.  For foot neuropathy, I am adding Cymbalta 60 mg once daily at bedtime. Continue Gabapentin as prescribed.   Return for diabetes follow-up in 3 months.    Diabetic Neuropathy Diabetic neuropathy is a nerve disease or nerve damage that is caused by diabetes mellitus. About half of all people with diabetes mellitus have some form of nerve damage. Nerve damage is more common in those who have had diabetes mellitus for many years and who generally have not had good control of their blood sugar (glucose) level. Diabetic neuropathy is a common complication of diabetes mellitus. There are three common types of diabetic neuropathy and a fourth type that is less common and less understood:  Peripheral neuropathy-This is the most common type of diabetic neuropathy. It causes damage to the nerves of the feet and legs first and then eventually the hands and arms. The damage affects the ability to sense touch.  Autonomic neuropathy-This type causes damage to the autonomic nervous system, which controls the following functions:  Heartbeat.  Body temperature.  Blood pressure.  Urination.  Digestion.  Sweating.  Sexual function.  Focal neuropathy-Focal neuropathy can be painful and unpredictable and occurs most often in older adults with diabetes mellitus. It involves a specific nerve or one area and often comes on suddenly. It usually does not cause long-term problems.  Radiculoplexus neuropathy- Sometimes called lumbosacral radiculoplexus neuropathy, radiculoplexus neuropathy affects the nerves of the thighs, hips, buttocks, or legs. It is more common in people with type 2 diabetes mellitus and in older men. It is characterized by debilitating pain, weakness, and atrophy, usually in the thigh muscles. What are the causes? The cause of peripheral, autonomic, and focal neuropathies is diabetes mellitus that is  uncontrolled and high glucose levels. The cause of radiculoplexus neuropathy is unknown. However, it is thought to be caused by inflammation related to uncontrolled glucose levels. What are the signs or symptoms? Peripheral Neuropathy  Peripheral neuropathy develops slowly over time. When the nerves of the feet and legs no longer work there may be:  Burning, stabbing, or aching pain in the legs or feet.  Inability to feel pressure or pain in your feet. This can lead to:  Thick calluses over pressure areas.  Pressure sores.  Ulcers.  Foot deformities.  Reduced ability to feel temperature changes.  Muscle weakness. Autonomic Neuropathy  The symptoms of autonomic neuropathy vary depending on which nerves are affected. Symptoms may include:  Problems with digestion, such as:  Feeling sick to your stomach (nausea).  Vomiting.  Bloating.  Constipation.  Diarrhea.  Abdominal pain.  Difficulty with urination. This occurs if you lose your ability to sense when your bladder is full. Problems include:  Urine leakage (incontinence).  Inability to empty your bladder completely (retention).  Rapid or irregular heartbeat (palpitations).  Blood pressure drops when you stand up (orthostatic hypotension). When you stand up you may feel:  Dizzy.  Weak.  Faint.  In men, inability to attain and maintain an erection.  In women, vaginal dryness and problems with decreased sexual desire and arousal.  Problems with body temperature regulation.  Increased or decreased sweating. Focal Neuropathy   Abnormal eye movements or abnormal alignment of both eyes.  Weakness in the wrist.  Foot drop. This results in an inability to lift the foot properly and abnormal walking or foot movement.  Paralysis on one side of your face (Bell palsy).  Chest or abdominal  pain. Radiculoplexus Neuropathy   Sudden, severe pain in your hip, thigh, or buttocks.  Weakness and wasting of thigh  muscles.  Difficulty rising from a seated position.  Abdominal swelling.  Unexplained weight loss (usually more than 10 lb [4.5 kg]). How is this diagnosed? Peripheral Neuropathy  Your senses may be tested. Sensory function testing can be done with:  A light touch using a monofilament.  A vibration with tuning fork.  A sharp sensation with a pin prick. Other tests that can help diagnose neuropathy are:  Nerve conduction velocity. This test checks the transmission of an electrical current through a nerve.  Electromyography. This shows how muscles respond to electrical signals transmitted by nearby nerves.  Quantitative sensory testing. This is used to assess how your nerves respond to vibrations and changes in temperature. Autonomic Neuropathy  Diagnosis is often based on reported symptoms. Tell your health care provider if you experience:  Dizziness.  Constipation.  Diarrhea.  Inappropriate urination or inability to urinate.  Inability to get or maintain an erection. Tests that may be done include:  Electrocardiography or Holter monitor. These are tests that can help show problems with the heart rate or heart rhythm.  An X-ray exam may be done. Focal Neuropathy  Diagnosis is made based on your symptoms and what your health care provider finds during your exam. Other tests may be done. They may include:  Nerve conduction velocities. This checks the transmission of electrical current through a nerve.  Electromyography. This shows how muscles respond to electrical signals transmitted by nearby nerves.  Quantitative sensory testing. This test is used to assess how your nerves respond to vibration and changes in temperature. Radiculoplexus Neuropathy   Often the first thing is to eliminate any other issue or problems that might be the cause, as there is no standard test for diagnosis.  X-ray exam of your spine and lumbar region.  Spinal tap to rule out cancer.  MRI  to rule out other lesions. How is this treated? Once nerve damage occurs, it cannot be reversed. The goal of treatment is to keep the disease or nerve damage from getting worse and affecting more nerve fibers. Controlling your blood glucose level is the key. Most people with radiculoplexus neuropathy see at least a partial improvement over time. You will need to keep your blood glucose and HbA1c levels in the target range determined by your health care provider. Things that help control blood glucose levels include:  Blood glucose monitoring.  Meal planning.  Physical activity.  Diabetes medicine. Over time, maintaining lower blood glucose levels helps lessen symptoms. Sometimes, prescription pain medicine is needed. Follow these instructions at home:  Do not smoke.  Keep your blood glucose level in the range that you and your health care provider have determined acceptable for you.  Keep your blood pressure level in the range that you and your health care provider have determined acceptable for you.  Eat a well-balanced diet.  Be physically active every day. Include strength training and balance exercises.  Protect your feet.  Check your feet every day for sores, cuts, blisters, or signs of infection.  Wear padded socks and supportive shoes. Use orthotic inserts, if necessary.  Regularly check the insides of your shoes for worn spots. Make sure there are no rocks or other items inside your shoes before you put them on. Contact a health care provider if:  You have burning, stabbing, or aching pain in the legs or feet.  You are  unable to feel pressure or pain in your feet.  You develop problems with digestion such as:  Nausea.  Vomiting.  Bloating.  Constipation.  Diarrhea.  Abdominal pain.  You have difficulty with urination, such as:  Incontinence.  Retention.  You have palpitations.  You develop orthostatic hypotension. When you stand up you may  feel:  Dizzy.  Weak.  Faint.  You cannot attain and maintain an erection (in men).  You have vaginal dryness and problems with decreased sexual desire and arousal (in women).  You have severe pain in your thighs, legs, or buttocks.  You have unexplained weight loss. This information is not intended to replace advice given to you by your health care provider. Make sure you discuss any questions you have with your health care provider. Document Released: 09/12/2001 Document Revised: 12/10/2015 Document Reviewed: 12/13/2012 Elsevier Interactive Patient Education  2017 [employer].

## 2016-11-11 NOTE — Progress Notes (Signed)
Patient ID: [name], male    DOB: 09/04/1955, 61 y.o.   MRN: [id number]  PCP: Molli Barrows, FNP  Chief Complaint  Patient presents with  . Follow-up    DIABETES    Subjective:  HPI William Ware is a 61 y.o. male presents for diabetes follow-up. William Ware recently established with this clinic 1 month ago.  Today he complains of worsening diabetic foot neuropathy. He reports mild relief with Gabapentin.  He has a pair old diabetic shoes from a prior podiatrist although he feels the shoes are not providing him adequate support. Last podiatry visit was last year. Pain is characterized as persistent numbness and tingling that intensifies during the night. Requesting a referral to podiatry. No home monitoring of blood sugar. Last A1C 6.4.  Social History   Social History  . Marital status: Widowed    Spouse name: N/A  . Number of children: N/A  . Years of education: N/A   Occupational History  . Not on file.   Social History Main Topics  . Smoking status: Current Some Day Smoker    Types: Cigarettes    Start date: 08/18/1970  . Smokeless tobacco: Never Used     Comment: 1-2 per day  . Alcohol use 0.0 oz/week  . Drug use: Yes    Types: Cocaine     Comment: heroin  . Sexual activity: Not on file   Other Topics Concern  . Not on file   Social History Narrative  . No narrative on file    History reviewed. No pertinent family history. Review of Systems See HPI  Patient Active Problem List   Diagnosis Date Noted  . Diabetic polyneuropathy ([place]) 09/15/2015  . Diabetes mellitus type II, uncontrolled ([place]) 09/02/2015  . Depression with anxiety 09/02/2015  . Hep C w/o coma, chronic ([place]) 09/02/2015  . Polyneuropathy due to type 2 diabetes mellitus ([place]) 09/02/2015  . Healthcare maintenance 09/02/2015  . Imprisonment and other incarceration 09/02/2015    No Known Allergies  Prior to Admission medications   Medication Sig Start Date End Date Taking?  Authorizing Provider  blood glucose meter kit and supplies KIT Dispense based on patient and insurance preference. Use up to four times daily as directed. (FOR ICD-9 250.00, 250.01). 10/11/16  Yes Scot Jun, FNP  gabapentin (NEURONTIN) 300 MG capsule Take 300 mg, (1 tablet) with breakfast. Take 600 mg, (2 tablets) after dinner. 10/11/16  Yes Scot Jun, FNP  glucose blood test strip Use as instructed 10/11/16  Yes Scot Jun, FNP  lisinopril (PRINIVIL,ZESTRIL) 5 MG tablet Take 5 mg by mouth daily.   Yes Historical Provider, MD  metFORMIN (GLUCOPHAGE) 1000 MG tablet Take 1 tablet (1,000 mg total) by mouth 2 (two) times daily with a meal. 10/11/16  Yes Scot Jun, FNP    Past Medical, Surgical Family and Social History reviewed and updated.    Objective:   Today's Vitals   11/11/16 0809  BP: 128/78  Pulse: 85  Resp: 16  Temp: 98.6 F (37 C)  TempSrc: Oral  SpO2: 100%  Weight: 177 lb (80.3 kg)  Height: '5\' 10"'$  (1.778 m)    Wt Readings from Last 3 Encounters:  11/11/16 177 lb (80.3 kg)  10/11/16 175 lb (79.4 kg)  09/15/15 168 lb 4.8 oz (76.3 kg)    Physical Exam  Constitutional: He is oriented to person, place, and time. He appears well-developed and well-nourished.  HENT:  Head: Normocephalic and atraumatic.  Eyes: Conjunctivae and EOM are normal. Pupils are equal, round, and reactive to light.  Neck: Normal range of motion. Neck supple.  Cardiovascular: Normal rate, regular rhythm, normal heart sounds and intact distal pulses.   Pulmonary/Chest: Effort normal and breath sounds normal.  Neurological: He is alert and oriented to person, place, and time.  Skin: Skin is warm and dry.  Psychiatric: He has a normal mood and affect. His behavior is normal. Judgment and thought content normal.       Assessment & Plan:  1. Type 2 diabetes mellitus without complication, without long-term current use of insulin (HCC) -Metformin 1,000, 2 times daily with  meals   2. Neuropathy -Continue Gabapentin 300 mg at breakfast and 600 mg at bedtime -Adding Duloxetine (Cymbalta) 60 mg once daily  -Ambulatory referral to Podiatry   3. 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 Kenton Kingfisher, MSN, [facility] [address] [place], [place] [address] [phone]

## 2016-11-12 LAB — MICROALBUMIN, URINE: Microalb, Ur: 159.6 mg/dL

## 2016-11-12 LAB — THYROID PANEL WITH TSH
Free Thyroxine Index: 2.6 (ref 1.4–3.8)
T3 Uptake: 23 % (ref 22–35)
T4, Total: 11.1 ug/dL (ref 4.5–12.0)
TSH: 1.21 mIU/L (ref 0.40–4.50)

## 2016-11-12 LAB — VITAMIN B12: VITAMIN B 12: 370 pg/mL (ref 200–1100)

## 2016-11-12 LAB — VITAMIN D 25 HYDROXY (VIT D DEFICIENCY, FRACTURES): Vit D, 25-Hydroxy: 18 ng/mL — ABNORMAL LOW (ref 30–100)

## 2016-11-14 MED ORDER — VITAMIN D (ERGOCALCIFEROL) 1.25 MG (50000 UNIT) PO CAPS: 50000.0000 [IU] | ORAL_CAPSULE | ORAL | 0 refills | Status: DC

## 2016-11-15 ENCOUNTER — Encounter: Payer: Self-pay | Admitting: Pediatric Intensive Care

## 2016-11-16 ENCOUNTER — Ambulatory Visit (INDEPENDENT_AMBULATORY_CARE_PROVIDER_SITE_OTHER): Payer: No Typology Code available for payment source | Admitting: Internal Medicine

## 2016-11-16 ENCOUNTER — Encounter: Payer: Self-pay | Admitting: Internal Medicine

## 2016-11-16 VITALS — BP 106/66 | HR 93 | Temp 98.1°F | Ht 69.0 in | Wt 171.0 lb

## 2016-11-16 DIAGNOSIS — B182 Chronic viral hepatitis C: Secondary | ICD-10-CM

## 2016-11-16 NOTE — Progress Notes (Signed)
[facility]   CC: consideration for treatment for chronic hepatitis C  HPI:  +William Ware is a 61 y.o. male who presents for initial evaluation and management of chronic hepatitis C.  Patient tested positive over 5 years ago while in prison. 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 Patient denies renal dialysis, sexual contact with person with liver disease. Patient has had other studies performed. Results: hepatitis C RNA by PCR, result: positive. Patient has not had prior treatment for Hepatitis C. Patient does not have a past history of liver disease. Patient does not have a family history of liver disease. Patient does not  have associated signs or symptoms related to liver disease.  Labs reviewed and confirm chronic hepatitis C with a positive viral load.  No genotype. Records reviewed from his PCP and he is on atorvastatin 20 mg, HCV viral load reviewed.   HIV negative last year.        Patient does not have documented immunity to Hepatitis A. Patient does not have documented immunity to Hepatitis B.    Review of Systems:  Constitutional: negative for fatigue, malaise and anorexia Gastrointestinal: negative for diarrhea Integument/breast: negative for rash Musculoskeletal: negative for myalgias and arthralgias All other systems reviewed and are negative       Past Medical History:  Diagnosis Date  . Chronic hepatitis C ([place])   . Depression   . Diabetes mellitus without complication ([place])   . Diabetic peripheral neuropathy associated with type 2 diabetes mellitus ([place])     Prior to Admission medications   Medication Sig Start Date End Date Taking? Authorizing Provider  amitriptyline (ELAVIL) 75 MG tablet Take 75 mg by mouth at bedtime.   Yes Historical Provider, MD  atorvastatin (LIPITOR) 20 MG tablet Take 20 mg by mouth daily.   Yes Historical Provider, MD  blood glucose meter  kit and supplies KIT Dispense based on patient and insurance preference. Use up to four times daily as directed. (FOR ICD-9 250.00, 250.01). 10/11/16  Yes Scot Jun, FNP  DULoxetine (CYMBALTA) 60 MG capsule Take 1 capsule (60 mg total) by mouth daily. 11/11/16  Yes Scot Jun, FNP  fluocinonide-emollient (LIDEX-E) 0.05 % cream Apply 1 application topically 2 (two) times daily. For eczema 11/11/16  Yes Scot Jun, FNP  gabapentin (NEURONTIN) 300 MG capsule Take 300 mg, (1 tablet) with breakfast. Take 600 mg, (2 tablets) after dinner. 10/11/16  Yes Scot Jun, FNP  glucose blood test strip Use as instructed 10/11/16  Yes Scot Jun, FNP  lisinopril (PRINIVIL,ZESTRIL) 5 MG tablet Take 2.5 mg by mouth daily.   Yes Historical Provider, MD  metFORMIN (GLUCOPHAGE) 1000 MG tablet Take 1 tablet (1,000 mg total) by mouth 2 (two) times daily with a meal. 10/11/16  Yes Scot Jun, FNP  Vitamin D, Ergocalciferol, (DRISDOL) 50000 units CAPS capsule Take 1 capsule (50,000 Units total) by mouth every 7 (seven) days. Patient not taking: Reported on 11/16/2016 11/14/16   Scot Jun, FNP    No Known Allergies  Social History  Substance Use Topics  . Smoking status: Former Smoker    Types: Cigarettes    Start date: 08/18/1970    Quit date: 10/17/2015  . Smokeless tobacco: Never Used     Comment: 1-2 per day  . Alcohol use No     Comment: none since 2016    [facility]:  no liver disease, no cirrhosis   Objective:  Constitutional: in no apparent distress and alert,  Vitals:   11/16/16 0843  BP: 106/66  Pulse: 93  Temp: 98.1 F (36.7 C)   Eyes: anicteric Cardiovascular: Cor RRR Respiratory: CTA B; normal respiratory effort Gastrointestinal: Bowel sounds are normal, liver is not enlarged, spleen is not enlarged, soft, nt Musculoskeletal: no pedal edema noted Skin: negatives: no rash; no porphyria cutanea tarda Lymphatic: no cervical  lymphadenopathy   Laboratory Genotype: No results found for: HCVGENOTYPE HCV viral load: No results found for: HCVQUANT Lab Results  Component Value Date   WBC 3.9 11/11/2016   HGB 11.5 (L) 11/11/2016   HCT 34.9 (L) 11/11/2016   MCV 94.8 11/11/2016   PLT 269 11/11/2016    Lab Results  Component Value Date   CREATININE 1.03 11/11/2016   BUN 12 11/11/2016   NA 139 11/11/2016   K 4.3 11/11/2016   CL 105 11/11/2016   CO2 22 11/11/2016    Lab Results  Component Value Date   ALT 24 11/11/2016   AST 28 11/11/2016   ALKPHOS 44 11/11/2016     Labs and history reviewed and show CHILD-PUGH unknown  5-6 points: Child class A 7-9 points: Child class B 10-15 points: Child class C  Lab Results  Component Value Date   BILITOT 0.2 11/11/2016   ALBUMIN 3.9 11/11/2016     Assessment: New Patient with Chronic Hepatitis C genotype unknown, untreated.  I discussed with the patient the lab findings that confirm chronic hepatitis C as well as the natural history and progression of disease including about 30% of people who develop cirrhosis of the liver if left untreated and once cirrhosis is established there is a 2-7% risk per year of liver cancer and liver failure.  I discussed the importance of treatment and benefits in reducing the risk, even if significant liver fibrosis exists.   Plan: 1) Patient counseled extensively on limiting acetaminophen to no more than 2 grams daily, avoidance of alcohol. 2) Transmission discussed with patient including sexual transmission, sharing razors and toothbrush.   3) Will need referral to gastroenterology if concern for cirrhosis 4) Will need referral for substance abuse counseling: No.; Further work up to include urine drug screen  No. 5) Will prescribe appropriate medication based on genotype once he gets labs after getting financial assistance - already has had a CMP and CBC  6) Hepatitis A and B titers with labs 7) Pneumovax vaccine when he has  [facility] assistance coverage 9) Further work up to include liver staging with elastography 10) will follow up after getting [facility] Assistance - form given to him.  Explained that the [employer] only covers visits.

## 2016-11-16 NOTE — Patient Instructions (Signed)
Date 11/16/16  Dear [name], As discussed in the ID Clinic, your hepatitis C therapy will include highly effective medication(s) for treatment and will vary based on the type of hepatitis C and insurance approval.  Potential medications include:          Harvoni (sofosbuvir /ledipasvir ) tablet oral daily          OR     Epclusa (sofosbuvir /velpatasvir ) tablet oral daily          OR      Mavyret (glecaprevir 100 mg/pibrentasvir 40 mg): Take 3 tablets oral daily          OR     Zepatier (elbasvir 50 mg/grazoprevir 100 mg) oral daily, +/- ribavirin              Medications are typically for 8 or 12 weeks total ---------------------------------------------------------------- Your HCV Treatment Start Date: You will be notified by our office once the medication is approved and where you can pick it up (or if mailed)   ---------------------------------------------------------------- YOUR PHARMACY CONTACT:   [facility] [address] [place], [place] [address] Phone: [phone] Hours: Monday to Friday 7:30 am to 6:00 pm   Please always contact your pharmacy at least 3-4 business days before you run out of medications to ensure your next month's medication is ready or 1 week prior to running out if you receive it by mail.  Remember, each prescription is for 28 days. ---------------------------------------------------------------- GENERAL NOTES REGARDING YOUR HEPATITIS C MEDICATION:  Some medications have the following interactions:  - Acid reducing agents such as H2 blockers (ie. Pepcid (famotidine), Zantac (ranitidine), Tagamet (cimetidine), Axid (nizatidine) and proton pump inhibitors (ie. Prilosec (omeprazole), Protonix (pantoprazole), Nexium (esomeprazole), or Aciphex (rabeprazole)). Do not take until you have discussed with a health care provider.    -Antacids that contain magnesium and/or aluminum hydroxide (ie. Milk of Magensia, Rolaids,  Gaviscon, Maalox, Mylanta, an dArthritis Pain Formula).  -Calcium carbonate (calcium supplements or antacids such as Tums, Caltrate, Os-Cal).  -St. John's wort or any products that contain St. John's wort like some herbal supplements  Please inform the office prior to starting any of these medications.  - The common side effects associated with Harvoni include:      1. Fatigue      2. Headache      3. Nausea      4. Diarrhea      5. Insomnia  Please note that this only lists the most common side effects and is NOT a comprehensive list of the potential side effects of these medications. For more information, please review the drug information sheets that come with your medication package from the pharmacy.  ---------------------------------------------------------------- GENERAL HELPFUL HINTS ON HCV THERAPY: 1. Stay well-hydrated. 2. Notify the ID Clinic of any changes in your other over-the-counter/herbal or prescription medications. 3. If you miss a dose of your medication, take the missed dose as soon as you remember. Return to your regular time/dose schedule the next day.  4.  Do not stop taking your medications without first talking with your healthcare provider. 5.  You may take Tylenol (acetaminophen), as long as the dose is less than 2000 mg (OR no more than 4 tablets of the Tylenol Extra Strengths  tablet) in 24 hours. 6.  You will see our pharmacist-specialist within the first 2 weeks of starting your medication to monitor for any possible side effects. 7.  You will have labs once during treatment, soon  after treatment completion and one final lab 6 months after treatment completion to verify the virus is out of your system.  Let us know when you have the financial assistance through [facility] and we can then get the labs  Staci Righter, MD  [facility] [address] [place] [address] [place], [place]   [address] [phone]

## 2016-11-18 ENCOUNTER — Encounter: Payer: Self-pay | Admitting: Pediatric Intensive Care

## 2016-11-21 NOTE — Congregational Nurse Program (Signed)
Congregational Nurse Program Note  Date of Encounter: 10/28/2016  Past Medical History: Past Medical History:  Diagnosis Date  . Chronic hepatitis C (HCC)   . Depression   . Diabetes mellitus without complication (HCC)   . Diabetic peripheral neuropathy associated with type 2 diabetes mellitus (HCC)     Encounter Details:     CNP Questionnaire - 10/28/16 0830      Patient Demographics   Is this a new or existing patient? Existing   Patient is considered a/an Not Applicable   Race African-American/Black     Patient Assistance   Location of Patient Assistance [facility]   Patient's financial/insurance status [employer] officer, trade unionCard/Care Connects   Uninsured Patient ([employer] Card/Care Connects) Yes   Interventions Not Applicable   Patient referred to apply for the following financial assistance Not Applicable   Food insecurities addressed Not Applicable   Transportation assistance No   Type of Assistance Other   Assistance securing medications Yes   Type of Assistance [facility]   Educational health offerings Medications     Encounter Details   Primary purpose of visit Navigating the Healthcare System   Was an Emergency Department visit averted? Not Applicable   Does patient have a medical provider? Yes   Patient referred to Follow up with established PCP   Was a mental health screening completed? (GAINS tool) No   Does patient have dental issues? No   Was a dental referral made? No   Does patient have vision issues? No   Does your patient have an abnormal blood pressure today? No   Since previous encounter, have you referred patient for abnormal blood pressure that resulted in a new diagnosis or medication change? No   Does your patient have an abnormal blood glucose today? No   Since previous encounter, have you referred patient for abnormal blood glucose that resulted in a new diagnosis or medication change? No   Was there a life-saving intervention made? No          Clinical Intake - 11/16/16 0853      Pre-visit preparation   Pre-visit preparation completed Yes     Pain   Pain  No/denies pain     Nutrition Screen   Nutritional Status BMI 25 -29 Overweight   Diabetes Yes   CBG done? No   Did pt. bring in CBG monitor from home? No     Functional Status   Activities of Daily Living Independent   Ambulation Independent with device- listed below   Home Assistive Devices/Equipment Cane   Medication Administration Independent   Home Management Independent     Abuse/Neglect   Do you feel unsafe in your current relationship? No   Do you feel physically threatened by others? No   Anyone hurting you at home, work, or school? No     Patient Literacy   How often do you need to have someone help you when you read instructions, pamphlets, or other written materials from your doctor or pharmacy? 1 - Never     Web designerLanguage Assistant   Interpreter Needed? No     Client [facility] CN called [facility] pharmacy with client. Medication is ready for pickup.

## 2016-11-28 ENCOUNTER — Other Ambulatory Visit: Payer: No Typology Code available for payment source | Admitting: Family Medicine

## 2016-12-01 ENCOUNTER — Telehealth: Payer: Self-pay

## 2016-12-01 NOTE — Telephone Encounter (Signed)
Labs are pending at Dr. Ephriam Knucklesomer's office and that me be where he is scheduled for a lab appointment.

## 2016-12-02 ENCOUNTER — Other Ambulatory Visit: Payer: No Typology Code available for payment source

## 2016-12-06 ENCOUNTER — Other Ambulatory Visit (INDEPENDENT_AMBULATORY_CARE_PROVIDER_SITE_OTHER): Payer: No Typology Code available for payment source

## 2016-12-06 ENCOUNTER — Other Ambulatory Visit: Payer: Self-pay | Admitting: Family Medicine

## 2016-12-06 DIAGNOSIS — D649 Anemia, unspecified: Secondary | ICD-10-CM

## 2016-12-06 LAB — HEMOGLOBIN AND HEMATOCRIT, BLOOD
HCT: 39.6 % (ref 38.5–50.0)
Hemoglobin: 12.5 g/dL — ABNORMAL LOW (ref 13.2–17.1)

## 2016-12-06 NOTE — Progress Notes (Signed)
Recheck of hemoglobin previously low.

## 2016-12-08 NOTE — Congregational Nurse Program (Signed)
Congregational Nurse Program Note  Date of Encounter: 11/18/2016  Past Medical History: Past Medical History:  Diagnosis Date  . Chronic hepatitis C (HCC)   . Depression   . Diabetes mellitus without complication (HCC)   . Diabetic peripheral neuropathy associated with type 2 diabetes mellitus (HCC)     Encounter Details:     CNP Questionnaire - 11/18/16 0900      Patient Demographics   Is this a new or existing patient? Existing   Patient is considered a/an Not Applicable   Race African-American/Black     Patient Assistance   Location of Patient Assistance [facility]   Patient's financial/insurance status [employer] officer, trade unionCard/Care Connects   Uninsured Patient ([employer] Card/Care Connects) Yes   Interventions Not Applicable   Patient referred to apply for the following financial assistance [facility]   Food insecurities addressed Not Applicable   Transportation assistance Yes   Type of Assistance [place] Given   Assistance securing medications No   Type of Assistance Other   Educational health offerings Navigating the healthcare system;Medications     Encounter Details   Primary purpose of visit Chronic Illness/Condition Visit;Education/Health Concerns   Was an Emergency Department visit averted? Not Applicable   Does patient have a medical provider? Yes   Patient referred to Follow up with established PCP   Was a mental health screening completed? (GAINS tool) No   Does patient have dental issues? Yes   Was a dental referral made? No   Does patient have vision issues? No   Does your patient have an abnormal blood pressure today? No   Since previous encounter, have you referred patient for abnormal blood pressure that resulted in a new diagnosis or medication change? No   Does your patient have an abnormal blood glucose today? No   Since previous encounter, have you referred patient for abnormal blood glucose that resulted in a new diagnosis or medication change? No   Was  there a life-saving intervention made? No         Clinical Intake - 11/16/16 0853      Pre-visit preparation   Pre-visit preparation completed Yes     Pain   Pain  No/denies pain     Nutrition Screen   Nutritional Status BMI 25 -29 Overweight   Diabetes Yes   CBG done? No   Did pt. bring in CBG monitor from home? No     Functional Status   Activities of Daily Living Independent   Ambulation Independent with device- listed below   Home Assistive Devices/Equipment Cane   Medication Administration Independent   Home Management Independent     Abuse/Neglect   Do you feel unsafe in your current relationship? No   Do you feel physically threatened by others? No   Anyone hurting you at home, work, or school? No     Patient Literacy   How often do you need to have someone help you when you read instructions, pamphlets, or other written materials from your doctor or pharmacy? 1 - Never     Web designerLanguage Assistant   Interpreter Needed? No    Client need assistance with financial assistance paperwork. Needs to complete in [facility]. Client ahs questions regarding starting high dose Vit D. He has started Cymbalta for neuropathy pain. CN will contact [facility] provider for clarification.

## 2016-12-08 NOTE — Congregational Nurse Program (Signed)
Congregational Nurse Program Note  Date of Encounter: 11/15/2016  Past Medical History: Past Medical History:  Diagnosis Date  . Chronic hepatitis C (HCC)   . Depression   . Diabetes mellitus without complication (HCC)   . Diabetic peripheral neuropathy associated with type 2 diabetes mellitus [facility](HCC)     Encounter Details:     CNP Questionnaire - 11/15/16 0815      Patient Demographics   Is this a new or existing patient? Existing   Patient is considered a/an Not Applicable   Race African-American/Black     Patient Assistance   Location of Patient Assistance [facility]   Patient's financial/insurance status [employer] officer, trade unionCard/Care Connects   Uninsured Patient ([employer] Card/Care Connects) Yes   Interventions Not Applicable   Patient referred to apply for the following financial assistance Not Applicable   Food insecurities addressed Not Applicable   Transportation assistance No   Type of Assistance Other   Assistance securing medications Yes   Type of Holiday representativeAssistance [facility]   Educational health offerings Medications;Navigating the healthcare system     Encounter Details   Primary purpose of visit Chronic Illness/Condition Visit;Navigating the Healthcare System;Education/Health Concerns   Was an Emergency Department visit averted? Not Applicable   Does patient have a medical provider? Yes   Patient referred to Follow up with established PCP   Was a mental health screening completed? (GAINS tool) No   Does patient have dental issues? Yes   Was a dental referral made? Yes   Does patient have vision issues? No   Does your patient have an abnormal blood pressure today? No   Since previous encounter, have you referred patient for abnormal blood pressure that resulted in a new diagnosis or medication change? No   Does your patient have an abnormal blood glucose today? No   Since previous encounter, have you referred patient for abnormal blood glucose that resulted in a new diagnosis or  medication change? No   Was there a life-saving intervention made? No         Clinical Intake - 11/16/16 0853      Pre-visit preparation   Pre-visit preparation completed Yes     Pain   Pain  No/denies pain     Nutrition Screen   Nutritional Status BMI 25 -29 Overweight   Diabetes Yes   CBG done? No   Did pt. bring in CBG monitor from home? No     Functional Status   Activities of Daily Living Independent   Ambulation Independent with device- listed below   Home Assistive Devices/Equipment Cane   Medication Administration Independent   Home Management Independent     Abuse/Neglect   Do you feel unsafe in your current relationship? No   Do you feel physically threatened by others? No   Anyone hurting you at home, work, or school? No     Patient Literacy   How often do you need to have someone help you when you read instructions, pamphlets, or other written materials from your doctor or pharmacy? 1 - Never     Web designerLanguage Assistant   Interpreter Needed? No

## 2016-12-21 ENCOUNTER — Telehealth: Payer: Self-pay

## 2016-12-21 NOTE — Telephone Encounter (Signed)
Spoke with patient regarding podiatry but he states that we need to cancel referral because he has another pcp and will not be continuing care with us.

## 2016-12-21 NOTE — Telephone Encounter (Signed)
Cancel any appointment that was scheduled. There is nothing on my end to do. thanks

## 2017-01-03 ENCOUNTER — Ambulatory Visit (INDEPENDENT_AMBULATORY_CARE_PROVIDER_SITE_OTHER): Payer: No Typology Code available for payment source | Admitting: Pharmacist

## 2017-01-03 DIAGNOSIS — B182 Chronic viral hepatitis C: Secondary | ICD-10-CM

## 2017-01-03 NOTE — Progress Notes (Signed)
HPI: William GauzeRobert J Ware is a 61 y.o. male who [facility] for Hep C follow-up.   No results found for: HCVGENOTYPE, HEPCGENOTYPE  Allergies: No Known Allergies  Past Medical History: Past Medical History:  Diagnosis Date  . Chronic hepatitis C (HCC)   . Depression   . Diabetes mellitus without complication (HCC)   . Diabetic peripheral neuropathy associated with type 2 diabetes mellitus (HCC)     Social History: Social History   Social History  . Marital status: Widowed    Spouse name: N/A  . Number of children: N/A  . Years of education: N/A   Social History Main Topics  . Smoking status: Former Smoker    Types: Cigarettes    Start date: 08/18/1970    Quit date: 10/17/2015  . Smokeless tobacco: Never Used     Comment: 1-2 per day  . Alcohol use No     Comment: none since 2016  . Drug use: No     Comment: none since 2016  . Sexual activity: Not on file   Other Topics Concern  . Not on file   Social History Narrative  . No narrative on file    Labs: No results found for: HIV1RNAQUANT, HIV1RNAVL, CD4TABS, HEPBSAB, HEPBSAG, HCVAB  No results found for: HCVGENOTYPE, HEPCGENOTYPE  No flowsheet data found.  AST  Date Value  11/11/2016 28 U/L  09/02/2015 48 IU/L (H)  11/21/2013 37 U/L   SGOT(AST) (Unit/L)  Date Value  11/24/2013 29   ALT  Date Value  11/11/2016 24 U/L  09/02/2015 52 IU/L (H)  11/21/2013 21 U/L   SGPT (ALT) (U/L)  Date Value  11/24/2013 24    CrCl: CrCl cannot be calculated (Patient's most recent lab result is older than the maximum 21 days allowed.).  Fibrosis Score: Unknown   Child-Pugh Score: Unknown  Previous Treatment Regimen: None  Assessment: William MaduroRobert recently saw Dr. Luciana Axeomer to initiate care for his Hep C infection.  He has no insurance or income, so he applied for [facility] financial assistance and he was approved.  He comes in today to meet with Kathie RhodesBetty and I, to get labs, and to start the process of  obtaining medications.  Kathie RhodesBetty explained the process and that he will need income documentation from his sister who supports him.  She will file for the medication as soon as he brings the paperwork back in and we have the labs back.    Plans: - Hep C labs today  Cassie L. Kuppelweiser, PharmD, CPP Infectious Diseases Clinical Pharmacist [facility] 01/03/2017, 3:21 PM

## 2017-01-04 LAB — PROTIME-INR
INR: 1.1
Prothrombin Time: 11.2 s (ref 9.0–11.5)

## 2017-01-04 LAB — HIV ANTIBODY (ROUTINE TESTING W REFLEX): HIV: NONREACTIVE

## 2017-01-04 LAB — HEPATITIS B SURFACE ANTIBODY,QUALITATIVE: Hep B S Ab: NEGATIVE

## 2017-01-04 LAB — HEPATITIS A ANTIBODY, TOTAL: HEP A TOTAL AB: REACTIVE — AB

## 2017-01-04 LAB — HEPATITIS B CORE ANTIBODY, TOTAL: Hep B Core Total Ab: REACTIVE — AB

## 2017-01-04 LAB — HEPATITIS B SURFACE ANTIGEN: Hepatitis B Surface Ag: NEGATIVE

## 2017-01-05 LAB — LIVER FIBROSIS, FIBROTEST-ACTITEST
ALT: 39 U/L (ref 9–46)
Alpha-2-Macroglobulin: 464 mg/dL — ABNORMAL HIGH (ref 106–279)
Apolipoprotein A1: 137 mg/dL (ref 94–176)
Bilirubin: 0.3 mg/dL (ref 0.2–1.2)
Fibrosis Score: 0.67
GGT: 35 U/L (ref 3–85)
Haptoglobin: 86 mg/dL (ref 43–212)
NECROINFLAMMAT ACT SCORE: 0.32
Reference ID: 1998011

## 2017-01-08 LAB — HCV RNA, QN PCR RFLX GENO, LIPA
HCV RNA, PCR, QN: 4470000 IU/mL — ABNORMAL HIGH
HCV RNA, PCR, QN: 6.65 log IU/mL — ABNORMAL HIGH

## 2017-01-08 LAB — HEPATITIS C GENOTYPE

## 2017-01-16 ENCOUNTER — Other Ambulatory Visit: Payer: Self-pay | Admitting: Internal Medicine

## 2017-01-16 MED ORDER — LEDIPASVIR-SOFOSBUVIR 90-400 MG PO TABS: 1.0000 | ORAL_TABLET | Freq: Every day | ORAL | 2 refills | Status: DC

## 2017-01-20 ENCOUNTER — Telehealth: Payer: Self-pay

## 2017-01-20 MED ORDER — FLUOCINONIDE-E 0.05 % EX CREA: 1.0000 "application " | TOPICAL_CREAM | Freq: Two times a day (BID) | CUTANEOUS | 2 refills | Status: DC

## 2017-01-20 NOTE — Telephone Encounter (Signed)
Cream has been refilled

## 2017-01-23 ENCOUNTER — Telehealth: Payer: Self-pay

## 2017-01-24 MED FILL — FLUOCINONIDE-E 0.05% CREAM: 0.05 | 30 days supply | Qty: 60 | Fill #1

## 2017-01-25 NOTE — Telephone Encounter (Signed)
Patient notified that script is ready at pharmacy  

## 2017-01-30 ENCOUNTER — Encounter: Payer: Self-pay | Admitting: Pharmacy Technician

## 2017-01-31 ENCOUNTER — Ambulatory Visit (INDEPENDENT_AMBULATORY_CARE_PROVIDER_SITE_OTHER): Payer: No Typology Code available for payment source | Admitting: Family Medicine

## 2017-01-31 ENCOUNTER — Encounter: Payer: Self-pay | Admitting: Family Medicine

## 2017-01-31 VITALS — BP 142/65 | HR 80 | Temp 98.3°F | Resp 16 | Ht 69.0 in | Wt 158.5 lb

## 2017-01-31 DIAGNOSIS — E119 Type 2 diabetes mellitus without complications: Secondary | ICD-10-CM

## 2017-01-31 DIAGNOSIS — E1121 Type 2 diabetes mellitus with diabetic nephropathy: Secondary | ICD-10-CM

## 2017-01-31 LAB — POCT URINALYSIS DIP (DEVICE)
BILIRUBIN URINE: NEGATIVE
Glucose, UA: 500 mg/dL — AB
KETONES UR: NEGATIVE mg/dL
LEUKOCYTES UA: NEGATIVE
Nitrite: NEGATIVE
Protein, ur: 100 mg/dL — AB
SPECIFIC GRAVITY, URINE: 1.02 (ref 1.005–1.030)
UROBILINOGEN UA: 1 mg/dL (ref 0.0–1.0)
pH: 6.5 (ref 5.0–8.0)

## 2017-01-31 LAB — POCT GLYCOSYLATED HEMOGLOBIN (HGB A1C): Hemoglobin A1C: 7.3

## 2017-01-31 MED ORDER — METFORMIN HCL 1000 MG PO TABS: 1000.0000 mg | ORAL_TABLET | Freq: Two times a day (BID) | ORAL | 2 refills | Status: AC

## 2017-01-31 MED ORDER — DULOXETINE HCL 60 MG PO CPEP
60.0000 mg | ORAL_CAPSULE | Freq: Every day | ORAL | 3 refills | Status: DC
Start: 2017-01-31 — End: 2017-02-23

## 2017-01-31 MED ORDER — FLUOCINONIDE-E 0.05 % EX CREA: 1.0000 "application " | TOPICAL_CREAM | Freq: Two times a day (BID) | CUTANEOUS | 2 refills | Status: DC

## 2017-01-31 MED ORDER — LISINOPRIL 5 MG PO TABS: 2.5000 mg | ORAL_TABLET | Freq: Every day | ORAL | 2 refills | Status: DC

## 2017-01-31 MED ORDER — PREGABALIN 150 MG PO CAPS: 150.0000 mg | ORAL_CAPSULE | Freq: Three times a day (TID) | ORAL | 1 refills | Status: DC

## 2017-01-31 MED FILL — DULoxetine HCL 60 MG CPEP: 60 | 30 days supply | Qty: 30 | Fill #0

## 2017-01-31 MED FILL — ?LISINOPRIL 5 MG TABLET: 5 | 30 days supply | Qty: 15 | Fill #0

## 2017-01-31 MED FILL — ?METFORMIN HCL 1,000 MG TAB: 1000 | 30 days supply | Qty: 60 | Fill #0

## 2017-01-31 MED FILL — FLUOCINONIDE-E 0.05% CREAM: 0.05 | 30 days supply | Qty: 120 | Fill #0

## 2017-01-31 NOTE — Patient Instructions (Addendum)
Your A1C is 7.3, goal A1C is <7.0%. Continue Metformin 1000 mg twice daily. Continue Lisinopril 2.5 mg once daily. For foot pain, start pregabilin 150 twice daily for foot pain. Resume Cymbalta 60 mg daily.  Neuropathic Pain Neuropathic pain is pain caused by damage to the nerves that are responsible for certain sensations in your body (sensory nerves). The pain can be caused by damage to:  The sensory nerves that send signals to your spinal cord and brain (peripheral nervous system).  The sensory nerves in your brain or spinal cord (central nervous system).  Neuropathic pain can make you more sensitive to pain. What would be a minor sensation for most people may feel very painful if you have neuropathic pain. This is usually a long-term condition that can be difficult to treat. The type of pain can differ from person to person. It may start suddenly (acute), or it may develop slowly and last for a long time (chronic). Neuropathic pain may come and go as damaged nerves heal or may stay at the same level for years. It often causes emotional distress, loss of sleep, and a lower quality of life. What are the causes? The most common cause of damage to a sensory nerve is diabetes. Many other diseases and conditions can also cause neuropathic pain. Causes of neuropathic pain can be classified as:  Toxic. Many drugs and chemicals can cause toxic damage. The most common cause of toxic neuropathic pain is damage from drug treatment for cancer (chemotherapy).  Metabolic. This type of pain can happen when a disease causes imbalances that damage nerves. Diabetes is the most common of these diseases. Vitamin B deficiency caused by long-term alcohol abuse is another common cause.  Traumatic. Any injury that cuts, crushes, or stretches a nerve can cause damage and pain. A common example is feeling pain after losing an arm or leg (phantom limb pain).  Compression-related. If a sensory nerve gets trapped or  compressed for a long period of time, the blood supply to the nerve can be cut off.  Vascular. Many blood vessel diseases can cause neuropathic pain by decreasing blood supply and oxygen to nerves.  Autoimmune. This type of pain results from diseases in which the body's defense system mistakenly attacks sensory nerves. Examples of autoimmune diseases that can cause neuropathic pain include lupus and multiple sclerosis.  Infectious. Many types of viral infections can damage sensory nerves and cause pain. Shingles infection is a common cause of this type of pain.  Inherited. Neuropathic pain can be a symptom of many diseases that are passed down through families (genetic).  What are the signs or symptoms? The main symptom is pain. Neuropathic pain is often described as:  Burning.  Shock-like.  Stinging.  Hot or cold.  Itching.  How is this diagnosed? No single test can diagnose neuropathic pain. Your health care provider will do a physical exam and ask you about your pain. You may use a pain scale to describe how bad your pain is. You may also have tests to see if you have a high sensitivity to pain and to help find the cause and location of any sensory nerve damage. These tests may include:  Imaging studies, such as: ? X-rays. ? CT scan. ? MRI.  Nerve conduction studies to test how well nerve signals travel through your sensory nerves (electrodiagnostic testing).  Stimulating your sensory nerves through electrodes on your skin and measuring the response in your spinal cord and brain (somatosensory evoked potentials).  How is this treated? Treatment for neuropathic pain may change over time. You may need to try different treatment options or a combination of treatments. Some options include:  Over-the-counter pain relievers.  Prescription medicines. Some medicines used to treat other conditions may also help neuropathic pain. These include medicines to: ? Control seizures  (anticonvulsants). ? Relieve depression (antidepressants).  Prescription-strength pain relievers (narcotics). These are usually used when other pain relievers do not help.  Transcutaneous nerve stimulation (TENS). This uses electrical currents to block painful nerve signals. The treatment is painless.  Topical and local anesthetics. These are medicines that numb the nerves. They can be injected as a nerve block or applied to the skin.  Alternative treatments, such as: ? Acupuncture. ? Meditation. ? Massage. ? Physical therapy. ? Pain management programs. ? Counseling.  Follow these instructions at home:  Learn as much as you can about your condition.  Take medicines only as directed by your health care provider.  Work closely with all your health care providers to find what works best for you.  Have a good support system at home.  Consider joining a chronic pain support group. Contact a health care provider if:  Your pain treatments are not helping.  You are having side effects from your medicines.  You are struggling with fatigue, mood changes, depression, or anxiety. This information is not intended to replace advice given to you by your health care provider. Make sure you discuss any questions you have with your health care provider. Document Released: 03/31/2004 Document Revised: 01/22/2016 Document Reviewed: 12/12/2013 Elsevier Interactive Patient Education  2018 [employer].   Diabetes and Foot Care Diabetes may cause you to have problems because of poor blood supply (circulation) to your feet and legs. This may cause the skin on your feet to become thinner, break easier, and heal more slowly. Your skin may become dry, and the skin may peel and crack. You may also have nerve damage in your legs and feet causing decreased feeling in them. You may not notice minor injuries to your feet that could lead to infections or more serious problems. Taking care of your feet is  one of the most important things you can do for yourself. Follow these instructions at home:  Wear shoes at all times, even in the house. Do not go barefoot. Bare feet are easily injured.  Check your feet daily for blisters, cuts, and redness. If you cannot see the bottom of your feet, use a mirror or ask someone for help.  Wash your feet with warm water (do not use hot water) and mild soap. Then pat your feet and the areas between your toes until they are completely dry. Do not soak your feet as this can dry your skin.  Apply a moisturizing lotion or petroleum jelly (that does not contain alcohol and is unscented) to the skin on your feet and to dry, brittle toenails. Do not apply lotion between your toes.  Trim your toenails straight across. Do not dig under them or around the cuticle. File the edges of your nails with an emery board or nail file.  Do not cut corns or calluses or try to remove them with medicine.  Wear clean socks or stockings every day. Make sure they are not too tight. Do not wear knee-high stockings since they may decrease blood flow to your legs.  Wear shoes that fit properly and have enough cushioning. To break in new shoes, wear them for just a few  hours a day. This prevents you from injuring your feet. Always look in your shoes before you put them on to be sure there are no objects inside.  Do not cross your legs. This may decrease the blood flow to your feet.  If you find a minor scrape, cut, or break in the skin on your feet, keep it and the skin around it clean and dry. These areas may be cleansed with mild soap and water. Do not cleanse the area with peroxide, alcohol, or iodine.  When you remove an adhesive bandage, be sure not to damage the skin around it.  If you have a wound, look at it several times a day to make sure it is healing.  Do not use heating pads or hot water bottles. They may burn your skin. If you have lost feeling in your feet or legs, you  may not know it is happening until it is too late.  Make sure your health care provider performs a complete foot exam at least annually or more often if you have foot problems. Report any cuts, sores, or bruises to your health care provider immediately. Contact a health care provider if:  You have an injury that is not healing.  You have cuts or breaks in the skin.  You have an ingrown nail.  You notice redness on your legs or feet.  You feel burning or tingling in your legs or feet.  You have pain or cramps in your legs and feet.  Your legs or feet are numb.  Your feet always feel cold. Get help right away if:  There is increasing redness, swelling, or pain in or around a wound.  There is a red line that goes up your leg.  Pus is coming from a wound.  You develop a fever or as directed by your health care provider.  You notice a bad smell coming from an ulcer or wound. This information is not intended to replace advice given to you by your health care provider. Make sure you discuss any questions you have with your health care provider. Document Released: 07/01/2000 Document Revised: 12/10/2015 Document Reviewed: 12/11/2012 Elsevier Interactive Patient Education  2017 [employer].

## 2017-01-31 NOTE — Progress Notes (Signed)
Patient ID: William Ware, male    DOB: 07/20/1955, 61 y.o.   MRN: [id number]  PCP: William Jun, FNP  Chief Complaint  Patient presents with  . Follow-up    3 month on diabetes  . Foot Pain    both    Subjective:  HPI William Ware is a 61 y.o. male presents for 3 month diabetes evaluation and bilateral foot pain. He doesn't monitor glucose at home. Reports that he is not currently taking Metformin daily as his A1C had improved previously. Last A1C 6.4.  Reports worsening bilateral foot pain, which persists in spite of regularly taking Gabapentin. Foot pain is his major problem and pain is causing him an inability to sleep as pain is 10/10 at bedtime. He denies any associated urinary frequency, visual disturbances, episodic diaphoresis or hot flashes. He has lose approximately 19 lbs since his last diabetes follow-up, 3 months ago. Current Body mass index is 23.41 kg/m. He obtains regular physical activity as he has no form of transportation and walks the majority of everywhere he goes. Social History   Social History  . Marital status: Widowed    Spouse name: N/A  . Number of children: N/A  . Years of education: N/A   Occupational History  . Not on file.   Social History Main Topics  . Smoking status: Former Smoker    Types: Cigarettes    Start date: 08/18/1970    Quit date: 10/17/2015  . Smokeless tobacco: Never Used     Comment: 1-2 per day  . Alcohol use No     Comment: none since 2016  . Drug use: No     Comment: none since 2016  . Sexual activity: Not on file   Other Topics Concern  . Not on file   Social History Narrative  . No narrative on file   Review of Systems See history of present illness Patient Active Problem List   Diagnosis Date Noted  . Proteinuria 11/11/2016  . Diabetic polyneuropathy ([place]) 09/15/2015  . Diabetes mellitus type II, uncontrolled ([place]) 09/02/2015  . Depression with anxiety 09/02/2015  . Hep C w/o coma, chronic ([place])  09/02/2015  . Polyneuropathy due to type 2 diabetes mellitus ([place]) 09/02/2015  . Healthcare maintenance 09/02/2015  . Imprisonment and other incarceration 09/02/2015    No Known Allergies  Prior to Admission medications   Medication Sig Start Date End Date Taking? Authorizing Provider  amitriptyline (ELAVIL) 75 MG tablet Take 75 mg by mouth at bedtime.    [provider]  atorvastatin (LIPITOR) 20 MG tablet Take 20 mg by mouth daily.    [provider]  blood glucose meter kit and supplies KIT Dispense based on patient and insurance preference. Use up to four times daily as directed. (FOR ICD-9 250.00, 250.01). Patient not taking: Reported on 01/31/2017 10/11/16   William Jun, FNP  DULoxetine (CYMBALTA) 60 MG capsule Take 1 capsule (60 mg total) by mouth daily. Patient not taking: Reported on 01/31/2017 11/11/16   William Jun, FNP  fluocinonide-emollient (LIDEX-E) 0.05 % cream Apply 1 application topically 2 (two) times daily. For eczema Patient not taking: Reported on 01/31/2017 01/20/17   William Jun, FNP  gabapentin (NEURONTIN) 300 MG capsule Take 300 mg, (1 tablet) with breakfast. Take 600 mg, (2 tablets) after dinner. Patient not taking: Reported on 01/31/2017 10/11/16   William Jun, FNP  glucose blood test strip Use as instructed Patient not taking: Reported on  01/31/2017 10/11/16   William Jun, FNP  Ledipasvir-Sofosbuvir (HARVONI) 90-400 MG TABS Take 1 tablet by mouth daily. Patient not taking: Reported on 01/31/2017 01/16/17   Thayer Headings, MD  lisinopril (PRINIVIL,ZESTRIL) 5 MG tablet Take 2.5 mg by mouth daily.    [provider]  metFORMIN (GLUCOPHAGE) 1000 MG tablet Take 1 tablet (1,000 mg total) by mouth 2 (two) times daily with a meal. Patient not taking: Reported on 01/31/2017 10/11/16   William Jun, FNP  Vitamin D, Ergocalciferol, (DRISDOL) 50000 units CAPS capsule Take 1 capsule (50,000 Units total) by mouth every 7  (seven) days. Patient not taking: Reported on 01/31/2017 11/14/16   William Jun, FNP    Past Medical, Surgical Family and Social History reviewed and updated.    Objective:   Today's Vitals   01/31/17 1052  BP: (!) 142/65  Pulse: 80  Resp: 16  Temp: 98.3 F (36.8 C)  TempSrc: Oral  SpO2: 100%  Weight: 158 lb 8 oz (71.9 kg)  Height: '5\' 9"'$  (1.753 m)    Wt Readings from Last 3 Encounters:  01/31/17 158 lb 8 oz (71.9 kg)  11/16/16 171 lb (77.6 kg)  11/11/16 177 lb (80.3 kg)   Physical Exam  Constitutional: He is oriented to person, place, and time. He appears well-developed and well-nourished.  HENT:  Head: Normocephalic and atraumatic.  Eyes: Pupils are equal, round, and reactive to light. Conjunctivae are normal.  Neck: Normal range of motion. Neck supple.  Cardiovascular: Normal rate, regular rhythm, normal heart sounds and intact distal pulses.   Pulmonary/Chest: Effort normal and breath sounds normal.  Abdominal: Soft. Bowel sounds are normal.  Musculoskeletal: Normal range of motion.  Neurological: He is alert and oriented to person, place, and time. He has normal reflexes.  Skin: Skin is warm and dry.  Psychiatric: He has a normal mood and affect. His behavior is normal. Judgment and thought content normal.   Assessment & Plan:  1. Type 2 diabetes mellitus without complication, without long-term current use of insulin (HCC) - POCT glycosylated hemoglobin (Hb A1C)-7.3 -Resume metformin 1000 mg 2 times daily  2. Diabetic nephropathy associated with type 2 diabetes mellitus (HCC) -Continue Cymbalta 60 mg daily -Discontinue gabapentin -Start Lyrica 150 mg 3 times daily for the neuropathy.  Return for care symptoms worsen or do not improve. Schedule follow-up for 1 month to evaluate foot pain.   William Sage. William Kingfisher, MSN, FNP-C [facility]  [address] [place], [place] [address] [phone]

## 2017-02-10 ENCOUNTER — Ambulatory Visit: Payer: No Typology Code available for payment source | Admitting: Family Medicine

## 2017-02-15 ENCOUNTER — Telehealth: Payer: Self-pay

## 2017-02-15 NOTE — Telephone Encounter (Signed)
Error

## 2017-02-20 ENCOUNTER — Ambulatory Visit: Payer: No Typology Code available for payment source

## 2017-02-22 ENCOUNTER — Ambulatory Visit (INDEPENDENT_AMBULATORY_CARE_PROVIDER_SITE_OTHER): Payer: No Typology Code available for payment source | Admitting: Pharmacist

## 2017-02-22 ENCOUNTER — Emergency Department (HOSPITAL_COMMUNITY)
Admission: EM | Admit: 2017-02-22 | Discharge: 2017-02-22 | Disposition: A | Discharge status: Home / Self Care | Payer: No Typology Code available for payment source | Attending: Emergency Medicine | Admitting: Emergency Medicine

## 2017-02-22 DIAGNOSIS — B182 Chronic viral hepatitis C: Secondary | ICD-10-CM

## 2017-02-22 DIAGNOSIS — Z5321 Procedure and treatment not carried out due to patient leaving prior to being seen by health care provider: Secondary | ICD-10-CM | POA: Insufficient documentation

## 2017-02-22 DIAGNOSIS — R1011 Right upper quadrant pain: Secondary | ICD-10-CM | POA: Insufficient documentation

## 2017-02-22 LAB — CBG MONITORING, ED: GLUCOSE-CAPILLARY: 123 mg/dL — AB (ref 65–99)

## 2017-02-22 NOTE — ED Notes (Signed)
Called pt to be reassessed, with no response.

## 2017-02-22 NOTE — Progress Notes (Signed)
HPI: William Ware is a 61 y.o. male who presents to the [facility] pharmacy to follow-up for his Hep C infection.  He has genotype 1a, F3 fibrosis, and started 12 weeks of Harvoni on 02/01/17.  Lab Results  Component Value Date   HCVGENOTYPE 1a 01/03/2017    Allergies: No Known Allergies  Past Medical History: Past Medical History:  Diagnosis Date  . Chronic hepatitis C ([place])   . Depression   . Diabetes mellitus without complication ([place])   . Diabetic peripheral neuropathy associated with type 2 diabetes mellitus ([place])     Social History: Social History   Social History  . Marital status: Widowed    Spouse name: N/A  . Number of children: N/A  . Years of education: N/A   Social History Main Topics  . Smoking status: Former Smoker    Types: Cigarettes    Start date: 08/18/1970    Quit date: 10/17/2015  . Smokeless tobacco: Never Used     Comment: 1-2 per day  . Alcohol use No     Comment: none since 2016  . Drug use: No     Comment: none since 2016  . Sexual activity: Not on file   Other Topics Concern  . Not on file   Social History Narrative  . No narrative on file    Labs: Hep B S Ab (no units)  Date Value  01/03/2017 NEG   Hepatitis B Surface Ag (no units)  Date Value  01/03/2017 NEGATIVE    Lab Results  Component Value Date   HCVGENOTYPE 1a 01/03/2017    No flowsheet data found.  AST  Date Value  11/11/2016 28 U/L  09/02/2015 48 IU/L (H)  11/21/2013 37 U/L   SGOT(AST) (Unit/L)  Date Value  11/24/2013 29   ALT  Date Value  01/03/2017 39 U/L  11/11/2016 24 U/L  09/02/2015 52 IU/L (H)  11/21/2013 21 U/L   SGPT (ALT) (U/L)  Date Value  11/24/2013 24   INR (no units)  Date Value  01/03/2017 1.1    CrCl: CrCl cannot be calculated (Patient's most recent lab result is older than the maximum 21 days allowed.).  Fibrosis Score: F3 as assessed by fibrosure   Child-Pugh Score: A  Previous Treatment  Regimen: None  Assessment: William Ware is here today to follow-up for his Hep C infection.  He started Harvoni ~3 weeks ago.  He is having some side effects. He complains of nausea, fatigue, and headaches.  I told him those were the three most common side effects associated with Harvoni and really all of our Hep C medications.  He is taking Motrin with no relief.  I told him to try OTC generic Excedrin to help with the headaches and maybe give him some energy too with the caffeine.  He has missed one dose since started because he simply forgot to take it.  I reiterated the importance of not missing anymore doses and he understood.  He is complaining heavily today about back pain. He states he think it may be a kidney stone, neuropathy, or his sciatic nerve pain.  He is saying he wants to go to the ED and is asking me to call an ambulance for him.  Tammy came in and met with him and he states his pain was a 10/10.  She then wheeled him over to the ED in a wheelchair.  I will call him to make an EOT appointment soon.    Plans: -  Continue Harvoni x 12 weeks - HCV RNA today - Will call for follow-up appointment.  William Ware L. Victoriah Wilds, PharmD, [facility] 02/23/2017, 10:30 AM

## 2017-02-22 NOTE — ED Triage Notes (Signed)
See downtime forms for triage documentation. 

## 2017-02-22 NOTE — ED Notes (Signed)
Pt called multiple times for VS reassessment and never responded. Pt left post triage.

## 2017-02-23 ENCOUNTER — Encounter (HOSPITAL_COMMUNITY): Payer: Self-pay | Admitting: Emergency Medicine

## 2017-02-23 ENCOUNTER — Ambulatory Visit (HOSPITAL_COMMUNITY)
Admission: EM | Admit: 2017-02-23 | Discharge: 2017-02-23 | Disposition: A | Discharge status: Home / Self Care | Payer: No Typology Code available for payment source | Attending: Internal Medicine | Admitting: Internal Medicine

## 2017-02-23 DIAGNOSIS — G8929 Other chronic pain: Secondary | ICD-10-CM

## 2017-02-23 DIAGNOSIS — E1142 Type 2 diabetes mellitus with diabetic polyneuropathy: Secondary | ICD-10-CM

## 2017-02-23 DIAGNOSIS — M545 Low back pain: Secondary | ICD-10-CM

## 2017-02-23 LAB — URINALYSIS, ROUTINE W REFLEX MICROSCOPIC
Bilirubin Urine: NEGATIVE
Glucose, UA: 50 mg/dL — AB
Hgb urine dipstick: NEGATIVE
KETONES UR: NEGATIVE mg/dL
Leukocytes, UA: NEGATIVE
Nitrite: NEGATIVE
PH: 5 (ref 5.0–8.0)
Protein, ur: 100 mg/dL — AB
Specific Gravity, Urine: 1.026 (ref 1.005–1.030)
Squamous Epithelial / LPF: NONE SEEN

## 2017-02-23 MED ORDER — HYDROCODONE-ACETAMINOPHEN 5-325 MG PO TABS: 1.0000 | ORAL_TABLET | ORAL | 0 refills | Status: AC | PRN

## 2017-02-23 NOTE — Discharge Instructions (Signed)
You are being prescribed a few tablets for pain in general. This does not generally help nerve pain very well. You must be compliant with intake all of your prescribed medication for nerve pain daily. We will not be able to continue to provide this type of pain medicine for you in the future. Call your primary care doctor today to discuss your pain and your medications.

## 2017-02-23 NOTE — ED Provider Notes (Signed)
[facility]    CSN: [id number] Arrival date & time: 02/23/17  1031     History   Chief Complaint Chief Complaint  Patient presents with  . Back Pain    HPI William Ware is a 61 y.o. male.   61 year old male with type 2 diabetes mellitus which was uncontrolled for several years is complaining of what he calls known neuropathic pain starting in his low back, bilateral legs and bottom of his feet. Describes it as a burning and pain in the needle sticks in his legs and bottom of the feet. Denies any known or recent injury. He states the pain is moderate to severe. He had been taking gabapentin for a period Of time but stopped working for him. He is currently taking Lyrica and Cymbalta which was added one month ago at the last visit.  amitriptyline is also listed on his medications but he is unsure if this is what he is taking. He has not called his PCP regarding his chronic pain. The PMH was reviewed.       Past Medical History:  Diagnosis Date  . Chronic hepatitis C (HCC)   . Depression   . Diabetes mellitus without complication (HCC)   . Diabetic peripheral neuropathy associated with type 2 diabetes mellitus [facility])     Patient Active Problem List   Diagnosis Date Noted  . Proteinuria 11/11/2016  . Diabetic polyneuropathy (HCC) 09/15/2015  . Diabetes mellitus type II, uncontrolled (HCC) 09/02/2015  . Depression with anxiety 09/02/2015  . Hep C w/o coma, chronic (HCC) 09/02/2015  . 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 Healthcare maintenance 09/02/2015  . Imprisonment and other incarceration 09/02/2015    History reviewed. No pertinent surgical history.     Home Medications    Prior to Admission medications   Medication Sig Start Date End Date Taking? Authorizing Provider  Ledipasvir-Sofosbuvir (HARVONI) 90-400 MG TABS Take 1 tablet by mouth daily. 01/16/17  Yes Comer, Belia Heman, MD  metFORMIN (GLUCOPHAGE) 1000 MG tablet Take 1  tablet (1,000 mg total) by mouth 2 (two) times daily with a meal. 01/31/17  Yes Bing Neighbors, FNP  amitriptyline (ELAVIL) 75 MG tablet Take 75 mg by mouth at bedtime.    [provider]  fluocinonide-emollient (LIDEX-E) 0.05 % cream Apply 1 application topically 2 (two) times daily. For eczema 01/31/17   Bing Neighbors, FNP  HYDROcodone-acetaminophen (NORCO/VICODIN) 5-325 MG tablet Take 1 tablet by mouth every 4 (four) hours as needed. 02/23/17   Hayden Rasmussen, NP  lisinopril (PRINIVIL,ZESTRIL) 5 MG tablet Take 0.5 tablets (2.5 mg total) by mouth daily. 01/31/17   Bing Neighbors, FNP  pregabalin (LYRICA) 150 MG capsule Take 1 capsule (150 mg total) by mouth 3 (three) times daily. 01/31/17   Bing Neighbors, FNP    Family History No family history on file.  Social History Social History  Substance Use Topics  . Smoking status: Former Smoker    Types: Cigarettes    Start date: 08/18/1970    Quit date: 10/17/2015  . Smokeless tobacco: Never Used     Comment: 1-2 per day  . Alcohol use No     Comment: none since 2016     Allergies   Patient has no known allergies.   Review of Systems Review of Systems  Constitutional: Negative.   Respiratory: Negative.   Cardiovascular: Negative for chest pain and leg swelling.  Gastrointestinal: Negative.   Genitourinary: Negative.  Neurological: Negative for dizziness and headaches.       Neuropathic pain and bilateral lower extremities.  All other systems reviewed and are negative.    Physical Exam Triage Vital Signs ED Triage Vitals  Enc Vitals Group     BP 02/23/17 1056 109/72     Pulse Rate 02/23/17 1056 89     Resp 02/23/17 1056 16     Temp 02/23/17 1056 98.2 F (36.8 C)     Temp Source 02/23/17 1056 Oral     SpO2 02/23/17 1056 98 %     Weight 02/23/17 1057 162 lb (73.5 kg)     Height 02/23/17 1057 5\' 9"  (1.753 m)     Head Circumference --      Peak Flow --      Pain Score 02/23/17 1105 10     Pain Loc --        Pain Edu? --      Excl. in GC? --    No data found.   Updated Vital Signs BP 109/72 (BP Location: Right Arm)   Pulse 89   Temp 98.2 F (36.8 C) (Oral)   Resp 16   Ht 5\' 9"  (1.753 m)   Wt 162 lb (73.5 kg)   SpO2 98%   BMI 23.92 kg/m   Visual Acuity Right Eye Distance:   Left Eye Distance:   Bilateral Distance:    Right Eye Near:   Left Eye Near:    Bilateral Near:     Physical Exam  Constitutional: He is oriented to person, place, and time. He appears well-developed and well-nourished.  Eyes: EOM are normal.  Neck: Neck supple.  Cardiovascular: Normal rate, regular rhythm and normal heart sounds.   Pulmonary/Chest: Effort normal and breath sounds normal. No respiratory distress.  Musculoskeletal: He exhibits no edema or deformity.  Neurological: He is alert and oriented to person, place, and time. He exhibits normal muscle tone.  Minor tenderness to lower extremities. No edema or lesions. Able to move lower extremities and ambulate with a cane.  Skin: Skin is warm and dry. He is not diaphoretic.  Psychiatric: He has a normal mood and affect.  Nursing note and vitals reviewed.    UC Treatments / Results  Labs (all labs ordered are listed, but only abnormal results are displayed) Labs Reviewed - No data to display  EKG  EKG Interpretation None       Radiology No results found.  Procedures Procedures (including critical care time)  Medications Ordered in UC Medications - No data to display   Initial Impression / Assessment and Plan / UC Course  I have reviewed the triage vital signs and the nursing notes.  Pertinent labs & imaging results that were available during my care of the patient were reviewed by me and considered in my medical decision making (see chart for details).    You are being prescribed a few tablets for pain in general. This does not generally help nerve pain very well. You must be compliant with intake all of your prescribed  medication for nerve pain daily. We will not be able to continue to provide this type of pain medicine for you in the future. Call your primary care doctor today to discuss your pain and your medications.    Final Clinical Impressions(s) / UC Diagnoses   Final diagnoses:  Chronic bilateral low back pain without sciatica  Diabetic polyneuropathy associated with type 2 diabetes mellitus (HCC)    New Prescriptions  New Prescriptions   HYDROCODONE-ACETAMINOPHEN (NORCO/VICODIN) 5-325 MG TABLET    Take 1 tablet by mouth every 4 (four) hours as needed.     Controlled Substance Prescriptions [place] Controlled Substance Registry consulted? Yes, I have consulted the [place] Controlled Substances Registry for this patient, and feel the risk/benefit ratio today is favorable for proceeding with this prescription for a controlled substance.   [name], Marializ Ferrebee, NP 02/23/17 1216

## 2017-02-23 NOTE — ED Triage Notes (Signed)
Lower back pain, pain into legs and bilateral feet.  Describes sharp, needle pain in feet.    Patient is seated in wheelchair, leaning forward, rubbing hands down both legs to and including his feet.  Intermittently rocking in wheelchair

## 2017-02-28 ENCOUNTER — Ambulatory Visit (INDEPENDENT_AMBULATORY_CARE_PROVIDER_SITE_OTHER): Payer: Self-pay | Admitting: Family Medicine

## 2017-02-28 ENCOUNTER — Encounter: Payer: Self-pay | Admitting: Family Medicine

## 2017-02-28 VITALS — BP 118/68 | HR 80 | Temp 98.4°F | Resp 14 | Ht 69.0 in | Wt 153.2 lb

## 2017-02-28 DIAGNOSIS — M5432 Sciatica, left side: Secondary | ICD-10-CM

## 2017-02-28 DIAGNOSIS — F1911 Other psychoactive substance abuse, in remission: Secondary | ICD-10-CM

## 2017-02-28 DIAGNOSIS — M5431 Sciatica, right side: Secondary | ICD-10-CM

## 2017-02-28 DIAGNOSIS — E1142 Type 2 diabetes mellitus with diabetic polyneuropathy: Secondary | ICD-10-CM

## 2017-02-28 DIAGNOSIS — M79671 Pain in right foot: Secondary | ICD-10-CM

## 2017-02-28 DIAGNOSIS — M79672 Pain in left foot: Secondary | ICD-10-CM

## 2017-02-28 DIAGNOSIS — Z87898 Personal history of other specified conditions: Secondary | ICD-10-CM

## 2017-02-28 LAB — HEPATITIS C RNA QUANTITATIVE
HCV Quantitative Log: <1.18 Log IU/mL — AB
HCV Quantitative: <15 IU/mL — AB

## 2017-02-28 MED ORDER — PREDNISONE 20 MG PO TABS: ORAL_TABLET | ORAL | 0 refills | Status: DC

## 2017-02-28 MED ORDER — TRAMADOL HCL 50 MG PO TABS: 50.0000 mg | ORAL_TABLET | Freq: Three times a day (TID) | ORAL | 0 refills | Status: DC | PRN

## 2017-02-28 MED ORDER — DEXAMETHASONE SODIUM PHOSPHATE 10 MG/ML IJ SOLN: 20.0000 mg | Freq: Once | INTRAMUSCULAR | Status: DC

## 2017-02-28 MED ORDER — DEXAMETHASONE SODIUM PHOSPHATE 10 MG/ML IJ SOLN
16.0000 mg | Freq: Once | INTRAMUSCULAR | Status: AC
  Administered 2017-02-28: 16 mg via INTRAMUSCULAR

## 2017-02-28 MED FILL — ?PREDNISONE 20 MG TABLET: 20 | 9 days supply | Qty: 18 | Fill #0

## 2017-02-28 NOTE — Patient Instructions (Addendum)
Start prednisone tomorrow morning with breakfast:  Prednisone 20 mg,  in mornings with breakfast as follows:   Take 3 pills for 3 days Take 2 pills for 3 days Take 1 pill for 3 days.   Complete all medication.  For moderate to severe pain: Take Tramadol 50-100 mg every 8 hours as needed for pain.  I am referring you to neurology for further evaluation and treatment of neuropathic pain in your feet.     Neuropathic Pain Neuropathic pain is pain caused by damage to the nerves that are responsible for certain sensations in your body (sensory nerves). The pain can be caused by damage to:  The sensory nerves that send signals to your spinal cord and brain (peripheral nervous system).  The sensory nerves in your brain or spinal cord (central nervous system).  Neuropathic pain can make you more sensitive to pain. What would be a minor sensation for most people may feel very painful if you have neuropathic pain. This is usually a long-term condition that can be difficult to treat. The type of pain can differ from person to person. It may start suddenly (acute), or it may develop slowly and last for a long time (chronic). Neuropathic pain may come and go as damaged nerves heal or may stay at the same level for years. It often causes emotional distress, loss of sleep, and a lower quality of life. What are the causes? The most common cause of damage to a sensory nerve is diabetes. Many other diseases and conditions can also cause neuropathic pain. Causes of neuropathic pain can be classified as:  Toxic. Many drugs and chemicals can cause toxic damage. The most common cause of toxic neuropathic pain is damage from drug treatment for cancer (chemotherapy).  Metabolic. This type of pain can happen when a disease causes imbalances that damage nerves. Diabetes is the most common of these diseases. Vitamin B deficiency caused by long-term alcohol abuse is another common cause.  Traumatic. Any injury that  cuts, crushes, or stretches a nerve can cause damage and pain. A common example is feeling pain after losing an arm or leg (phantom limb pain).  Compression-related. If a sensory nerve gets trapped or compressed for a long period of time, the blood supply to the nerve can be cut off.  Vascular. Many blood vessel diseases can cause neuropathic pain by decreasing blood supply and oxygen to nerves.  Autoimmune. This type of pain results from diseases in which the body's defense system mistakenly attacks sensory nerves. Examples of autoimmune diseases that can cause neuropathic pain include lupus and multiple sclerosis.  Infectious. Many types of viral infections can damage sensory nerves and cause pain. Shingles infection is a common cause of this type of pain.  Inherited. Neuropathic pain can be a symptom of many diseases that are passed down through families (genetic).  What are the signs or symptoms? The main symptom is pain. Neuropathic pain is often described as:  Burning.  Shock-like.  Stinging.  Hot or cold.  Itching.  How is this diagnosed? No single test can diagnose neuropathic pain. Your health care provider will do a physical exam and ask you about your pain. You may use a pain scale to describe how bad your pain is. You may also have tests to see if you have a high sensitivity to pain and to help find the cause and location of any sensory nerve damage. These tests may include:  Imaging studies, such as: ? X-rays. ? CT  scan. ? MRI.  Nerve conduction studies to test how well nerve signals travel through your sensory nerves (electrodiagnostic testing).  Stimulating your sensory nerves through electrodes on your skin and measuring the response in your spinal cord and brain (somatosensory evoked potentials).  How is this treated? Treatment for neuropathic pain may change over time. You may need to try different treatment options or a combination of treatments. Some options  include:  Over-the-counter pain relievers.  Prescription medicines. Some medicines used to treat other conditions may also help neuropathic pain. These include medicines to: ? Control seizures (anticonvulsants). ? Relieve depression (antidepressants).  Prescription-strength pain relievers (narcotics). These are usually used when other pain relievers do not help.  Transcutaneous nerve stimulation (TENS). This uses electrical currents to block painful nerve signals. The treatment is painless.  Topical and local anesthetics. These are medicines that numb the nerves. They can be injected as a nerve block or applied to the skin.  Alternative treatments, such as: ? Acupuncture. ? Meditation. ? Massage. ? Physical therapy. ? Pain management programs. ? Counseling.  Follow these instructions at home:  Learn as much as you can about your condition.  Take medicines only as directed by your health care provider.  Work closely with all your health care providers to find what works best for you.  Have a good support system at home.  Consider joining a chronic pain support group. Contact a health care provider if:  Your pain treatments are not helping.  You are having side effects from your medicines.  You are struggling with fatigue, mood changes, depression, or anxiety. This information is not intended to replace advice given to you by your health care provider. Make sure you discuss any questions you have with your health care provider. Document Released: 03/31/2004 Document Revised: 01/22/2016 Document Reviewed: 12/12/2013 Elsevier Interactive Patient Education  [employer]2018 Elsevier Inc.

## 2017-02-28 NOTE — Progress Notes (Signed)
Patient ID: William Ware, male    DOB: Feb 26, 1956, 61 y.o.   MRN: [id number]  PCP: [name], Tiegan Jambor S, FNP  Chief Complaint  Patient presents with  . Follow-up    2 weeks on foot pain    Subjective:  HPI  William Ware is a 61 y.o. male presents for evaluation of 2 weeks of foot pain. Foot pain, chronic and has remained unresponsive to Gabapentin, Amitriptyline, and Cymbalta. He was recently prescribed Lyrica at his last office visit on 01/31/2017, however he reports that medication was only recently approved through his medication assistance program and will not arrive for approximately 2 week. [facility] with complaint of 10/10, low back pain with bilateral sciatica. He feels that the back pain was exacerbated by his worsening diabetic foot neuropathy.  Pain is most pronounced at bedtime and has for months interfered with sleep. [name] was prescribed short course of Vicodin during his most recent visit at Urgent Care and reports relief of pain with medication.   Social History   Social History  . Marital status: Widowed    Spouse name: N/A  . Number of children: N/A  . Years of education: N/A   Occupational History  . Not on file.   Social History Main Topics  . Smoking status: Former Smoker    Types: Cigarettes    Start date: 08/18/1970    Quit date: 10/17/2015  . Smokeless tobacco: Never Used     Comment: 1-2 per day  . Alcohol use No     Comment: none since 2016  . Drug use: No     Comment: none since 2016  . Sexual activity: Not on file   Other Topics Concern  . Not on file   Social History Narrative  . No narrative on file   Family History  Problem Relation Age of Onset  . Family history unknown: Yes   Review of Systems See HPI   Patient Active Problem List   Diagnosis Date Noted  . Hx of substance abuse 03/05/2017  . Proteinuria 11/11/2016  . Diabetic polyneuropathy (HCC) 09/15/2015  . Diabetes mellitus type II,  uncontrolled (HCC) 09/02/2015  . Depression with anxiety 09/02/2015  . Hep C w/o coma, chronic (HCC) 09/02/2015  . 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 Healthcare maintenance 09/02/2015  . Imprisonment and other incarceration 09/02/2015    No Known Allergies  Prior to Admission medications   Medication Sig Start Date End Date Taking? Authorizing Provider  amitriptyline (ELAVIL) 75 MG tablet Take 75 mg by mouth at bedtime.   Yes [provider]  fluocinonide-emollient (LIDEX-E) 0.05 % cream Apply 1 application topically 2 (two) times daily. For eczema 01/31/17  Yes [name], Kerri Asche S, FNP  HYDROcodone-acetaminophen (NORCO/VICODIN) 5-325 MG tablet Take 1 tablet by mouth every 4 (four) hours as needed. 02/23/17  Yes Mabe, David, NP  Ledipasvir-Sofosbuvir (HARVONI) 90-400 MG TABS Take 1 tablet by mouth daily. 01/16/17  Yes Comer, Belia Hemanobert W, MD  metFORMIN (GLUCOPHAGE) 1000 MG tablet Take 1 tablet (1,000 mg total) by mouth 2 (two) times daily with a meal. 01/31/17  Yes [name], Nochum Fenter S, FNP  pregabalin (LYRICA) 150 MG capsule Take 1 capsule (150 mg total) by mouth 3 (three) times daily. 01/31/17  Yes [name], Seri Kimmer S, FNP  lisinopril (PRINIVIL,ZESTRIL) 5 MG tablet Take 0.5 tablets (2.5 mg total) by mouth daily. Patient not taking: Reported on 02/28/2017 01/31/17   Tiburcio PeaHarris,  Godfrey Pick, FNP   Past Medical, Surgical Family and Social History reviewed and updated.   Objective:   Today's Vitals   02/28/17 1305  BP: 118/68  Pulse: 80  Resp: 14  Temp: 98.4 F (36.9 C)  TempSrc: Oral  SpO2: 100%  Weight: 153 lb 3.2 oz (69.5 kg)  Height: 5\' 9"  (1.753 m)   Wt Readings from Last 3 Encounters:  02/28/17 153 lb 3.2 oz (69.5 kg)  02/23/17 162 lb (73.5 kg)  01/31/17 158 lb 8 oz (71.9 kg)   Physical Exam  Constitutional: He is oriented to person, place, and time. He appears well-developed and well-nourished.  HENT:  Head: Normocephalic and atraumatic.  Eyes:  Pupils are equal, round, and reactive to light. Conjunctivae and EOM are normal.  Neck: Normal range of motion. Neck supple. No thyromegaly present.  Cardiovascular: Normal rate, regular rhythm, normal heart sounds and intact distal pulses.   Pulmonary/Chest: Effort normal and breath sounds normal.  Musculoskeletal: He exhibits tenderness.  Antalgic gait and uses cane to aid with ambulation   Lymphadenopathy:    He has no cervical adenopathy.  Neurological: He is alert and oriented to person, place, and time.  Skin: Skin is warm and dry.  Psychiatric: He has a normal mood and affect. His behavior is normal. Judgment and thought content normal.   Assessment & Plan:  1. Bilateral sciatica - Ambulatory referral to Neurology - dexamethasone (DECADRON) injection 16 mg; Inject 1.6 mLs (16 mg total) into the muscle once. -Advised patient that it is against office policy and my medical judgement to prescribed on-going pain treatment with Vicodin.I agreed to trial him with tramadol 50-100 mg every 8 hours as needed for moderate to severe pain.  2. Diabetic polyneuropathy associated with type 2 diabetes mellitus (HCC) -Continue Gabapentin and Cymbalta. Start Lyrica once medication is available.  3. Foot pain, bilateral -Continue Gabapentin and Cymbalta. Start Lyrica once medication is available  4. Hx of substance abuse -Advised patient that it is against office policy and my medical judgement to prescribed on-going pain treatment with Vicodin.I agreed to trial him with tramadol 50-100 mg every 8 hours as needed for moderate to severe pain.  RTC: 1 month for follow-up on back and foot pain.  Godfrey Pick. Tiburcio Pea, MSN, FNP-C [facility]  [address] [place], [place] [address] [phone]

## 2017-03-05 ENCOUNTER — Encounter: Payer: Self-pay | Admitting: Family Medicine

## 2017-03-05 DIAGNOSIS — F1911 Other psychoactive substance abuse, in remission: Secondary | ICD-10-CM | POA: Insufficient documentation

## 2017-03-09 ENCOUNTER — Telehealth: Payer: Self-pay

## 2017-03-12 NOTE — Telephone Encounter (Signed)
William Ware,  Please contact Mr. Yebra to advise that he will need to complete the [facility] Patient Assistance Application in order to obtain coverage for him to see Neurology.  Godfrey Pick. Tiburcio Pea, MSN, FNP-C [facility]  [address] [place], [place] [address] [phone]

## 2017-03-13 NOTE — Telephone Encounter (Signed)
Patient states that he has already been approved and will bring letter by

## 2017-03-15 ENCOUNTER — Telehealth: Payer: Self-pay | Admitting: Lab

## 2017-03-15 NOTE — Telephone Encounter (Signed)
I [facility]iev[facility] a [facility] in the [facility] yesterday [facility].   It was not in scanned into the patient's

## 2017-03-15 NOTE — Telephone Encounter (Signed)
Chart.  After making several phone calls, I finally received it by fax.  I left a message for the patient to call me so that I could mail him a copy or he could come by the office to pick it up.  He had misplaced his copy.

## 2017-03-16 ENCOUNTER — Telehealth: Payer: Self-pay | Admitting: Family Medicine

## 2017-03-16 MED ORDER — FLUOCINONIDE-E 0.05 % EX CREA: 1.0000 "application " | TOPICAL_CREAM | Freq: Two times a day (BID) | CUTANEOUS | 2 refills | Status: DC

## 2017-03-16 NOTE — Telephone Encounter (Signed)
Notify patient as follows: Patient had requested a refill on tramadol and Lidex cream. Refilled Lidex cream however tramadol will not be eligible for refill until 03/31/2017 as he was prescribed 60 tablets on 02/28/2017 to take every 8 hours "as needed" for severe leg pain.  He should also have his Lyrica now as it was on back order at the pharmacy. Please process podiatry and neurology referrals as patient now has [facility] 100% discount.   Godfrey PickKimberly S. Tiburcio PeaHarris, MSN, FNP-C [facility]  [address] [place], [place] [id number] [phone]

## 2017-03-17 NOTE — Telephone Encounter (Signed)
Spoke with patient, advised that Only Lidex cream was refilled and that the Tramadol was too soon until 03/31/2017 and explained why. Advised that he should be able to get the Lyrica now and use that as well. Advised that we are sending referrals to podiatry and neurology. Thanks!

## 2017-03-21 ENCOUNTER — Ambulatory Visit: Payer: No Typology Code available for payment source | Attending: Family Medicine

## 2017-03-21 MED FILL — GABAPENTIN 300 MG CAPSULE: 300 | 30 days supply | Qty: 90 | Fill #0

## 2017-03-21 MED FILL — FLUOCINONIDE-E 0.05% CREAM: 0.05 | 30 days supply | Qty: 60 | Fill #2

## 2017-03-21 MED FILL — ?METFORMIN HCL 1,000 MG TAB: 1000 | 30 days supply | Qty: 60 | Fill #1

## 2017-03-21 MED FILL — DULoxetine HCL 60 MG CPEP: 60 | 30 days supply | Qty: 30 | Fill #1

## 2017-03-28 ENCOUNTER — Encounter: Payer: Self-pay | Admitting: Family Medicine

## 2017-03-28 ENCOUNTER — Ambulatory Visit (INDEPENDENT_AMBULATORY_CARE_PROVIDER_SITE_OTHER): Payer: Self-pay | Admitting: Family Medicine

## 2017-03-28 VITALS — BP 128/68 | HR 83 | Temp 98.2°F | Resp 14 | Ht 69.0 in | Wt 151.4 lb

## 2017-03-28 DIAGNOSIS — M79672 Pain in left foot: Secondary | ICD-10-CM

## 2017-03-28 DIAGNOSIS — M79671 Pain in right foot: Secondary | ICD-10-CM

## 2017-03-28 MED ORDER — TRAMADOL HCL 50 MG PO TABS: 50.0000 mg | ORAL_TABLET | Freq: Three times a day (TID) | ORAL | 0 refills | Status: DC | PRN

## 2017-03-28 MED FILL — traMADol HCL 50 MG TABS: 50 | 10 days supply | Qty: 60 | Fill #0

## 2017-03-28 NOTE — Progress Notes (Signed)
Patient ID: William Ware, male    DOB: Jun 06, 1956, 61 y.o.   MRN: [id number]  PCP: [name], Kelley Polinsky S, FNP  Chief Complaint  Patient presents with  . Follow-up    BACK AND FEET PAIN    Subjective:  HPI William Ware is a 61 y.o. male with type 2 diabetes and chronic bilateral neuropathy presents for medication management. William MaduroRobert currently receives monthly prescriptions for tramadol for chronic, persistent, on-going bilateral foot pain. He has failed treatment with Gabapentin and Cymbalta. He has been prescribed Lyrica, however has been unable to obtain medication and is currently pending medication assistance due to cost of medication. He now how has financial assistance and is requesting a referral to podiatry for evaluation of on-going of foot pain. Social History   Social History  . Marital status: Widowed    Spouse name: N/A  . Number of children: N/A  . Years of education: N/A   Occupational History  . Not on file.   Social History Main Topics  . Smoking status: Former Smoker    Types: Cigarettes    Start date: 08/18/1970    Quit date: 10/17/2015  . Smokeless tobacco: Never Used     Comment: 1-2 per day  . Alcohol use No     Comment: none since 2016  . Drug use: No     Comment: none since 2016  . Sexual activity: Not on file   Other Topics Concern  . Not on file   Social History Narrative  . No narrative on file    Family History  Problem Relation Age of Onset  . Family history unknown: Yes   Review of Systems See HPI  Patient Active Problem List   Diagnosis Date Noted  . Hx of substance abuse 03/05/2017  . Proteinuria 11/11/2016  . Diabetic polyneuropathy (HCC) 09/15/2015  . Diabetes mellitus type II, uncontrolled (HCC) 09/02/2015  . Depression with anxiety 09/02/2015  . Hep C w/o coma, chronic (HCC) 09/02/2015  . 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 Healthcare maintenance 09/02/2015  . Imprisonment and other  incarceration 09/02/2015    No Known Allergies  Prior to Admission medications   Medication Sig Start Date End Date Taking? Authorizing Provider  Ledipasvir-Sofosbuvir (HARVONI) 90-400 MG TABS Take 1 tablet by mouth daily. 01/16/17  Yes Comer, Belia Hemanobert W, MD  metFORMIN (GLUCOPHAGE) 1000 MG tablet Take 1 tablet (1,000 mg total) by mouth 2 (two) times daily with a meal. 01/31/17  Yes [name], Quanika Solem S, FNP  traMADol (ULTRAM) 50 MG tablet Take 1-2 tablets (50-100 mg total) by mouth every 8 (eight) hours as needed. 02/28/17  Yes [name], Oliviagrace Crisanti S, FNP  fluocinonide-emollient (LIDEX-E) 0.05 % cream Apply 1 application topically 2 (two) times daily. For eczema Patient not taking: Reported on 03/28/2017 03/16/17   [name], Deneene Tarver S, FNP  HYDROcodone-acetaminophen (NORCO/VICODIN) 5-325 MG tablet Take 1 tablet by mouth every 4 (four) hours as needed. Patient not taking: Reported on 03/28/2017 02/23/17   [name], David, NP  lisinopril (PRINIVIL,ZESTRIL) 5 MG tablet Take 0.5 tablets (2.5 mg total) by mouth daily. Patient not taking: Reported on 03/28/2017 01/31/17   [name], Madiline Saffran S, FNP  pregabalin (LYRICA) 150 MG capsule Take 1 capsule (150 mg total) by mouth 3 (three) times daily. Patient not taking: Reported on 03/28/2017 01/31/17   [name], Brailyn Delman S, FNP    Past Medical, Surgical Family and Social History reviewed and updated.    Objective:   Today's Vitals  03/28/17 1426  BP: 128/68  Pulse: 83  Resp: 14  Temp: 98.2 F (36.8 C)  TempSrc: Oral  SpO2: 100%  Weight: 151 lb 6.4 oz (68.7 kg)  Height:  (1.753 m)    Wt Readings from Last 3 Encounters:  03/28/17 151 lb 6.4 oz (68.7 kg)  02/28/17 153 lb 3.2 oz (69.5 kg)  02/23/17 162 lb (73.5 kg)   Physical Exam  Constitutional: He is oriented to person, place, and time. He appears well-developed and well-nourished.  HENT:  Head: Normocephalic and atraumatic.  Right Ear: External ear normal.  Left Ear: External ear normal.  Nose: Nose  normal.  Mouth/Throat: Oropharynx is clear and moist.  Eyes: Pupils are equal, round, and reactive to light. Conjunctivae are normal.  Neck: Normal range of motion. Neck supple.  Cardiovascular: Normal rate, regular rhythm, normal heart sounds and intact distal pulses.   Pulmonary/Chest: Effort normal and breath sounds normal.  Musculoskeletal:  antalgic gait and uses a cane  for ambulation   Neurological: He is alert and oriented to person, place, and time.  Skin: Skin is warm and dry.  Psychiatric: He has a normal mood and affect. His behavior is normal. Judgment and thought content normal.   Assessment & Plan:  1. Foot pain, bilateral, chronic  -continue tramadol 50-100 every 8 hours as needed for pain -Podiatry appointment schedule with [facility]   RTC: 1 month for medication refill  Godfrey Pick. Tiburcio Pea, MSN, FNP-C [facility]  [address] [place], [place] [address] [phone]

## 2017-03-28 NOTE — Patient Instructions (Addendum)
You have an appointment scheduled with [facility] [address]  Appointment 04/13/2017 at 2:30 pm with Dr. Samuella CotaPrice . [facility], ID, and list of medications.  Your next tramadol refill 04/27/2017

## 2017-04-13 ENCOUNTER — Ambulatory Visit (INDEPENDENT_AMBULATORY_CARE_PROVIDER_SITE_OTHER): Payer: Medicaid Other | Admitting: Podiatry

## 2017-04-13 DIAGNOSIS — G5750 Tarsal tunnel syndrome, unspecified lower limb: Secondary | ICD-10-CM | POA: Diagnosis not present

## 2017-04-13 DIAGNOSIS — G629 Polyneuropathy, unspecified: Secondary | ICD-10-CM

## 2017-04-13 MED ORDER — BETAMETHASONE SOD PHOS & ACET 6 (3-3) MG/ML IJ SUSP
6.0000 mg | Freq: Once | INTRAMUSCULAR | Status: AC
  Administered 2017-04-13: 6 mg

## 2017-04-13 NOTE — Progress Notes (Signed)
   Subjective:    Patient ID: William Ware, male    DOB: 1955/11/16, 61 y.o.   MRN: [id number]  HPI  Chief Complaint  Patient presents with  . Peripheral Neuropathy    B/L - burning sensation, pinns and needles x 3 yrs.    61 year old male presents today for chief complaint of bilateral burning sensation to his feet. He complains of burning pins and needles that was not alleviated by gabapentin. He has attempted to obtain Lyrica though this is pending through his insurance. He endorses significant pain from the numbness. He denies other pedal issues.  Review of Systems  Constitutional: Negative.   HENT: Negative.   Cardiovascular: Positive for leg swelling.       Calf pain with walking   Skin: Positive for rash.  All other systems reviewed and are negative.     Objective:   Physical Exam There were no vitals filed for this visit. General AA&O x3. Normal mood and affect.  Vascular Dorsalis pedis and posterior tibial pulses  present 2+ and absent bilaterally  Capillary refill normal to all digits. Pedal hair growth normal.  Neurologic Epicritic sensation grossly present. SWMF 0/5 bilaterally.  Dermatologic No open lesions. Interspaces clear of maceration. Nails well groomed and normal in appearance.  Orthopedic: MMT 5/5 in dorsiflexion, plantarflexion, inversion, and eversion. Normal joint ROM without pain or crepitus.     Assessment & Plan:  Patient was evaluated and treated in all questions answered.  Diabetic peripheral neuropathy, bilateral -Patient educated on etiology. -Discussed with patient continuing process to obtain Lyrica. -Bilateral tarsal tunnel injections delivered as below in attempt to alleviate painful nerve symptoms.  Procedure: Injection Tarsal Tunnel Location: Bilateral tarsal tunnel Skin Prep: Alcohol. Injectate: 1 cc 0.5% marcaine plain, 0.5 cc celestone. Disposition: Patient tolerated procedure well. Injection site dressed with a band-aid.

## 2017-04-18 ENCOUNTER — Ambulatory Visit: Payer: Self-pay | Admitting: Neurology

## 2017-04-18 ENCOUNTER — Other Ambulatory Visit: Payer: Self-pay | Admitting: Family Medicine

## 2017-04-18 ENCOUNTER — Other Ambulatory Visit: Payer: Self-pay | Admitting: Internal Medicine

## 2017-04-18 ENCOUNTER — Telehealth: Payer: Self-pay | Admitting: Neurology

## 2017-04-18 MED FILL — ?METFORMIN HCL 1,000 MG TAB: 1000 | 30 days supply | Qty: 60 | Fill #2

## 2017-04-18 MED FILL — ?DULOXETINE HCL DR 60 MG CA: 60 MG | 30 days supply | Qty: 30 | Fill #2

## 2017-04-18 NOTE — Telephone Encounter (Signed)
This patient did not show for a new patient appointment today. 

## 2017-04-19 ENCOUNTER — Encounter: Payer: Self-pay | Admitting: Neurology

## 2017-04-19 MED FILL — FLUOCINONIDE-E 0.05% CREAM: 0.05 | 15 days supply | Qty: 60 | Fill #1

## 2017-04-20 MED FILL — GABAPENTIN 300 MG CAPSULE: 300 | 30 days supply | Qty: 90 | Fill #1

## 2017-04-21 ENCOUNTER — Other Ambulatory Visit: Payer: Self-pay | Admitting: *Deleted

## 2017-04-21 DIAGNOSIS — E119 Type 2 diabetes mellitus without complications: Secondary | ICD-10-CM

## 2017-04-21 MED ORDER — PREGABALIN 150 MG PO CAPS: 150.0000 mg | ORAL_CAPSULE | Freq: Three times a day (TID) | ORAL | 3 refills | Status: DC

## 2017-04-21 NOTE — Telephone Encounter (Signed)
PRINTED FOR PASS PROGRAM 

## 2017-04-28 ENCOUNTER — Encounter: Payer: Self-pay | Admitting: Podiatry

## 2017-04-28 ENCOUNTER — Ambulatory Visit (INDEPENDENT_AMBULATORY_CARE_PROVIDER_SITE_OTHER): Payer: Medicaid Other | Admitting: Podiatry

## 2017-04-28 DIAGNOSIS — G5750 Tarsal tunnel syndrome, unspecified lower limb: Secondary | ICD-10-CM | POA: Diagnosis not present

## 2017-04-28 DIAGNOSIS — G629 Polyneuropathy, unspecified: Secondary | ICD-10-CM | POA: Diagnosis not present

## 2017-04-28 DIAGNOSIS — B353 Tinea pedis: Secondary | ICD-10-CM

## 2017-04-28 MED ORDER — KETOCONAZOLE 2 % EX CREA: TOPICAL_CREAM | CUTANEOUS | 0 refills | Status: DC

## 2017-05-01 NOTE — Progress Notes (Signed)
  Subjective:  Patient ID: William Ware, male    DOB: January 22, 1956,  MRN: [id number]  Chief Complaint  Patient presents with  . Peripheral Neuropathy    b/l - pt stated "still having pins and needles feeling and numbness"   . Nail Problem    debride/diabetic    61 y.o. male returns for the above complaint. States the injections at his ankles helped somewhat with his pain. States he is still in the process of getting Lyrica and that was it was recently approved. New complaint of itching to both feet  Objective:   General AA&O x3. Normal mood and affect.  Vascular Dorsalis pedis and posterior tibial pulses  present 2+ and absent bilaterally  Capillary refill normal to all digits. Pedal hair growth normal.  Neurologic Epicritic sensation grossly present. SWMF 0/5 bilaterally.  Dermatologic No open lesions. Xerosis with scaling present to the plantar foot bilaterally Nails well groomed and normal in appearance.  Orthopedic: MMT 5/5 in dorsiflexion, plantarflexion, inversion, and eversion. Normal joint ROM without pain or crepitus.   Assessment & Plan:  Patient was evaluated and treated and all questions answered.  Tinea pedis -Rx ketoconazole  Peripheral neuropathy -Repeat injections to bilateral tarsal tunnels -Continue with gabapentin until Lyrica is obtained. Advised to follow with PCP for dosing  Procedure: Injection Tarsal Tunnel Location: Bilateral tarsal tunnel Skin Prep: Alcohol. Injectate: 1 cc 0.5% marcaine plain, 0.5 cc Dexamethasone. Disposition: Patient tolerated procedure well. Injection site dressed with a band-aid.

## 2017-05-08 ENCOUNTER — Telehealth: Payer: Self-pay

## 2017-05-08 ENCOUNTER — Other Ambulatory Visit: Payer: Self-pay | Admitting: Family Medicine

## 2017-05-08 ENCOUNTER — Other Ambulatory Visit: Payer: Self-pay | Admitting: Internal Medicine

## 2017-05-08 MED ORDER — TRAMADOL HCL 50 MG PO TABS: 50.0000 mg | ORAL_TABLET | Freq: Four times a day (QID) | ORAL | 0 refills | Status: DC | PRN

## 2017-05-08 MED FILL — traMADol HCL 50 MG TABS: 50 | 15 days supply | Qty: 60 | Fill #0

## 2017-05-08 NOTE — Progress Notes (Signed)
Reprinted tramadol prescription with practice address.

## 2017-05-24 ENCOUNTER — Ambulatory Visit (INDEPENDENT_AMBULATORY_CARE_PROVIDER_SITE_OTHER): Payer: Medicaid Other | Admitting: Pharmacist Clinician (PhC)/ Clinical Pharmacy Specialist

## 2017-05-24 DIAGNOSIS — Z23 Encounter for immunization: Secondary | ICD-10-CM

## 2017-05-24 DIAGNOSIS — B182 Chronic viral hepatitis C: Secondary | ICD-10-CM | POA: Diagnosis not present

## 2017-05-24 LAB — COMPLETE METABOLIC PANEL WITH GFR
AG Ratio: 1.1 (calc) (ref 1.0–2.5)
ALBUMIN MSPROF: 4.2 g/dL (ref 3.6–5.1)
ALT: 10 U/L (ref 9–46)
AST: 19 U/L (ref 10–35)
Alkaline phosphatase (APISO): 54 U/L (ref 40–115)
BUN: 17 mg/dL (ref 7–25)
CALCIUM: 10.2 mg/dL (ref 8.6–10.3)
CO2: 27 mmol/L (ref 20–32)
CREATININE: 1.09 mg/dL (ref 0.70–1.25)
Chloride: 103 mmol/L (ref 98–110)
GFR, EST AFRICAN AMERICAN: 85 mL/min/{1.73_m2} (ref >=60)
GFR, EST NON AFRICAN AMERICAN: 73 mL/min/{1.73_m2} (ref >=60)
GLUCOSE: 164 mg/dL — AB (ref 65–99)
Globulin: 3.7 g/dL (calc) (ref 1.9–3.7)
Potassium: 4.4 mmol/L (ref 3.5–5.3)
Sodium: 138 mmol/L (ref 135–146)
TOTAL PROTEIN: 7.9 g/dL (ref 6.1–8.1)
Total Bilirubin: 0.3 mg/dL (ref 0.2–1.2)

## 2017-05-24 NOTE — Patient Instructions (Signed)
Come back in 1 month for the 2nd hep B vaccine Come back in 3 months for the cure labs visit then the cure visit with Dr. Luciana Axeomer Come back in May for your 3rd hep B

## 2017-05-24 NOTE — Progress Notes (Signed)
HPI: William Ware is a 61 y.o. male who is here for his EOT visit with pharmacy for his hep C.   Lab Results  Component Value Date   HCVGENOTYPE 1a 01/03/2017    Allergies: No Known Allergies  Vitals:    Past Medical History: Past Medical History:  Diagnosis Date  . Chronic hepatitis C (HCC)   . Depression   . Diabetes mellitus without complication (HCC)   . Diabetic peripheral neuropathy associated with type 2 diabetes mellitus (HCC)     Social History: Social History   Socioeconomic History  . Marital status: Widowed    Spouse name: Not on file  . Number of children: Not on file  . Years of education: Not on file  . Highest education level: Not on file  Social Needs  . Financial resource strain: Not on file  . Food insecurity - worry: Not on file  . Food insecurity - inability: Not on file  . Transportation needs - medical: Not on file  . Transportation needs - non-medical: Not on file  Occupational History  . Not on file  Tobacco Use  . Smoking status: Former Smoker    Types: Cigarettes    Start date: 08/18/1970    Last attempt to quit: 10/17/2015    Years since quitting: 1.6  . Smokeless tobacco: Never Used  . Tobacco comment: 1-2 per day  Substance and Sexual Activity  . Alcohol use: No    Alcohol/week: 0.0 oz    Comment: none since 2016  . Drug use: No    Comment: none since 2016  . Sexual activity: Not on file  Other Topics Concern  . Not on file  Social History Narrative  . Not on file    Labs: Hep B S Ab (no units)  Date Value  01/03/2017 NEG   Hepatitis B Surface Ag (no units)  Date Value  01/03/2017 NEGATIVE    Lab Results  Component Value Date   HCVGENOTYPE 1a 01/03/2017    Hepatitis C RNA quantitative Latest Ref Rng & Units 02/23/2017  HCV Quantitative NOT DETECTED IU/mL <15 DETECTED(A)  HCV Quantitative Log NOT DETECTED Log IU/mL <1.18 DETECTED(A)    AST  Date Value  11/11/2016 28 U/L  09/02/2015 48 IU/L (H)  11/21/2013 37  U/L   SGOT(AST) (Unit/L)  Date Value  11/24/2013 29   ALT  Date Value  01/03/2017 39 U/L  11/11/2016 24 U/L  09/02/2015 52 IU/L (H)  11/21/2013 21 U/L   SGPT (ALT) (U/L)  Date Value  11/24/2013 24   INR (no units)  Date Value  01/03/2017 1.1    CrCl: CrCl cannot be calculated (Patient's most recent lab result is older than the maximum 21 days allowed.).  Fibrosis Score: F3 as assessed by Fibrosure   Child-Pugh Score: Class A  Previous Treatment Regimen: None  Assessment: William Ware finished his Harvoni about 2 wks ago. He missed one dose of medication. He didn't experience much side effects from the medication. Explained the process of hep C to him again and the meaning of his fibrosis score. His platelets are >150k. He'll f/u with Dr. Luciana Axeomer for the cure visit since we will need to get an US. He has resolved hep B but doesn't have protective ab, therefore, we will start the series again. Will also give him a dose of PNA. Appts for SVR12, 2nd/3rd hep B, and cure visit with Dr. Luciana Axeomer. He refused the flu shot.   Recommendations:  EOT hep  C VL/Cmet today SVR12 in 3 mo then cure visit with Dr. Luciana Axeomer Will need US PNA today Hep B #1, f/u in 1 mo for second hep B  Dakota Stangl, Pharm.D., BCPS, AAHIVP Clinical Infectious Disease Pharmacist [facility] 05/24/2017, 9:58 AM

## 2017-05-26 LAB — HEPATITIS C RNA QUANTITATIVE
HCV QUANT LOG: NOT DETECTED {Log_IU}/mL
HCV RNA, PCR, QN: NOT DETECTED [IU]/mL

## 2017-05-31 ENCOUNTER — Encounter: Payer: Self-pay | Admitting: Family Medicine

## 2017-05-31 ENCOUNTER — Ambulatory Visit (INDEPENDENT_AMBULATORY_CARE_PROVIDER_SITE_OTHER): Payer: Medicaid Other | Admitting: Family Medicine

## 2017-05-31 VITALS — BP 136/80 | HR 87 | Temp 98.9°F | Ht 69.0 in | Wt 171.2 lb

## 2017-05-31 DIAGNOSIS — Z8042 Family history of malignant neoplasm of prostate: Secondary | ICD-10-CM

## 2017-05-31 DIAGNOSIS — T25222A Burn of second degree of left foot, initial encounter: Secondary | ICD-10-CM

## 2017-05-31 DIAGNOSIS — E119 Type 2 diabetes mellitus without complications: Secondary | ICD-10-CM

## 2017-05-31 DIAGNOSIS — E1141 Type 2 diabetes mellitus with diabetic mononeuropathy: Secondary | ICD-10-CM | POA: Diagnosis not present

## 2017-05-31 LAB — PSA: PSA: 0.6 ng/mL (ref <=4.0)

## 2017-05-31 MED ORDER — CEPHALEXIN 500 MG PO CAPS: 500.0000 mg | ORAL_CAPSULE | Freq: Three times a day (TID) | ORAL | 0 refills | Status: DC

## 2017-05-31 MED ORDER — TRAMADOL HCL 50 MG PO TABS: 50.0000 mg | ORAL_TABLET | Freq: Four times a day (QID) | ORAL | 0 refills | Status: DC | PRN

## 2017-05-31 MED ORDER — PREGABALIN 200 MG PO CAPS: 200.0000 mg | ORAL_CAPSULE | Freq: Three times a day (TID) | ORAL | 2 refills | Status: DC

## 2017-05-31 MED FILL — GABAPENTIN 300 MG CAPSULE: 300 | 30 days supply | Qty: 90 | Fill #2

## 2017-05-31 MED FILL — traMADol HCL 50 MG TABS: 50 | 15 days supply | Qty: 60 | Fill #0

## 2017-05-31 MED FILL — CEPHALEXIN 500 MG CAPSULE: 500 | 7 days supply | Qty: 21 | Fill #0

## 2017-05-31 MED FILL — FLUOCINONIDE-E 0.05% CREAM: 0.05 | 15 days supply | Qty: 60 | Fill #2

## 2017-05-31 NOTE — Patient Instructions (Signed)
Prostate-Specific Antigen Test Why am I having this test? The prostate-specific antigen (PSA) test is performed to determine how much PSA you have in your blood. PSA is a type of protein that is normally present in the prostate gland. Certain conditions can cause PSA blood levels to increase, such as:  Infection in the prostate (prostatitis).  Enlargement of the prostate (hypertrophy).  Prostate cancer.  Because PSA levels increase greatly from prostate cancer, this test can be used to confirm a diagnosis of prostate cancer. It may also be used to monitor treatment for prostate cancer and to watch for a return of prostate cancer after treatment has finished. This test has a very high false-positive rate. Therefore, routine PSA screening for all men is no longer recommended. A false-positive result is incorrect because it indicates a condition or finding is present when it is not. What kind of sample is taken? A blood sample is required for this test. It is usually collected by inserting a needle into a vein or by sticking a finger with a small needle. How do I prepare for this test? There is no preparation required for this test. However, there are factors that can affect the results of a PSA test. To get the most accurate results:  Avoid having a rectal exam within several hours before having your blood drawn for this test.  Avoid having any procedures performed on the prostate gland within 6 weeks of having this test.  Avoid ejaculating within 24 hours of having this test.  Tell your health care provider if you had a recent urinary tract infection (UTI).  Tell your health care provider if you are taking medicines to assist with hair growth, such as finasteride.  Tell your health care provider if you have been exposed to a medicine called diethylstilbestrol.  Let your health care provider know if any of these factors apply to you. You may be asked to reschedule the test. What are the  reference ranges? Reference ranges are established after testing a large group of people. Reference ranges may vary among different people, labs, and hospitals. It is your responsibility to obtain your test results. Ask the lab or department performing the test when and how you will get your results.  Low: 0-2.5 ng/mL.  Slightly to moderately elevated: 2.6-10.0 ng/mL.  Moderately elevated: 10.0-19.9 ng/mL.  Significantly elevated: 20 ng/mL or greater.  What do the results mean? PSA test results greater than 4 ng/mL are found in the majority of men with prostate cancer. If your test result is above this level, this can indicate an increased risk for prostate cancer. Increased PSA levels can also indicate other health conditions. Talk with your health care provider to discuss your results, treatment options, and if necessary, the need for more tests. Talk with your health care provider if you have any questions about your results. Talk with your health care provider to discuss your results, treatment options, and if necessary, the need for more tests. Talk with your health care provider if you have any questions about your results. This information is not intended to replace advice given to you by your health care provider. Make sure you discuss any questions you have with your health care provider. Document Released: 08/06/2004 Document Revised: 03/09/2016 Document Reviewed: 11/27/2013 Elsevier Interactive Patient Education  2018 Elsevier Inc.  
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
## 2017-06-15 ENCOUNTER — Other Ambulatory Visit: Payer: Self-pay | Admitting: Family Medicine

## 2017-06-15 ENCOUNTER — Other Ambulatory Visit: Payer: Medicaid Other

## 2017-06-15 ENCOUNTER — Other Ambulatory Visit: Payer: Self-pay

## 2017-06-15 DIAGNOSIS — Z021 Encounter for pre-employment examination: Secondary | ICD-10-CM
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
## 2017-06-15 NOTE — Progress Notes (Signed)
Patient left a sample

## 2017-06-17 LAB — DRUG SCREEN URINE W/ALC, NO CONF
ALCOHOL, ETHYL (U): NEGATIVE
AMPHETAMINES (1000 NG/ML SCRN): POSITIVE — AB
BARBITURATES: NEGATIVE
BENZODIAZEPINES: NEGATIVE
COCAINE METABOLITES: NEGATIVE
MARIJUANA MET (50 ng/mL SCRN): NEGATIVE
METHADONE: NEGATIVE
METHAQUALONE: NEGATIVE
OPIATES: NEGATIVE
PHENCYCLIDINE: NEGATIVE
PROPOXYPHENE: NEGATIVE

## 2017-06-20 ENCOUNTER — Telehealth: Payer: Self-pay | Admitting: Family Medicine

## 2017-06-20 NOTE — Telephone Encounter (Signed)
Patient has been notified and will come by office to fill out release. He wants to discuss the drug screen with [name] at his appointment on 06/27/2017

## 2017-06-20 NOTE — Telephone Encounter (Signed)
Please contact [name] to advise his recent urine drug screen was positive for amphetamines.  Due to recent findings in his urine drug screen he will no longer be eligible to receive tramadol or any other controlled prescriptions from this urine drug screen be forwarded to a program that he is currently enrolled and he will need to sign a medical release form in order for William Ware to release these results or we can give a copy of results to him and he can forward it to the parties is requesting.  William PickKimberly S. Tiburcio PeaHarris, MSN, FNP-C [facility]  [address] [place], [place] [address] [phone]

## 2017-06-22 ENCOUNTER — Other Ambulatory Visit: Payer: Self-pay | Admitting: *Deleted

## 2017-06-22 DIAGNOSIS — E119 Type 2 diabetes mellitus without complications: Secondary | ICD-10-CM

## 2017-06-22 MED ORDER — PREGABALIN 200 MG PO CAPS: 200.0000 mg | ORAL_CAPSULE | Freq: Three times a day (TID) | ORAL | 1 refills | Status: DC

## 2017-06-22 NOTE — Telephone Encounter (Signed)
PRINTED FOR PASS PROGRAM 

## 2017-06-27 ENCOUNTER — Ambulatory Visit: Payer: No Typology Code available for payment source

## 2017-06-27 ENCOUNTER — Ambulatory Visit: Payer: No Typology Code available for payment source | Admitting: Family Medicine

## 2017-06-28 ENCOUNTER — Ambulatory Visit (INDEPENDENT_AMBULATORY_CARE_PROVIDER_SITE_OTHER): Payer: Medicaid Other | Admitting: Family Medicine

## 2017-06-28 ENCOUNTER — Ambulatory Visit: Payer: No Typology Code available for payment source | Admitting: Family Medicine

## 2017-06-28 VITALS — BP 130/70 | HR 88 | Temp 98.2°F | Resp 14 | Ht 69.0 in | Wt 180.4 lb

## 2017-06-28 DIAGNOSIS — E119 Type 2 diabetes mellitus without complications: Secondary | ICD-10-CM

## 2017-06-28 DIAGNOSIS — G8929 Other chronic pain: Secondary | ICD-10-CM | POA: Diagnosis not present

## 2017-06-28 DIAGNOSIS — F1911 Other psychoactive substance abuse, in remission: Secondary | ICD-10-CM

## 2017-06-28 DIAGNOSIS — Z87898 Personal history of other specified conditions: Secondary | ICD-10-CM | POA: Diagnosis not present

## 2017-06-28 LAB — POCT URINALYSIS DIP (DEVICE)
Bilirubin Urine: NEGATIVE
GLUCOSE, UA: 100 mg/dL — AB
Hgb urine dipstick: NEGATIVE
LEUKOCYTES UA: NEGATIVE
NITRITE: NEGATIVE
PROTEIN: 100 mg/dL — AB
Specific Gravity, Urine: >=1.03 (ref 1.005–1.030)
UROBILINOGEN UA: 0.2 mg/dL (ref 0.0–1.0)
pH: 6 (ref 5.0–8.0)

## 2017-06-28 LAB — POCT GLYCOSYLATED HEMOGLOBIN (HGB A1C): Hemoglobin A1C: 7.1

## 2017-06-28 MED ORDER — GABAPENTIN 300 MG PO CAPS: 300.0000 mg | ORAL_CAPSULE | Freq: Three times a day (TID) | ORAL | 3 refills | Status: DC

## 2017-06-28 NOTE — Progress Notes (Signed)
Patient ID: William GauzeRobert J Athey, male    DOB: Nov 01, 1955, 61 y.o.   MRN: [id number]  PCP: [name], Addysin Porco S, FNP  Chief Complaint  Patient presents with  . Follow-up    Subjective:  HPI William Ware is a 61 y.o. male presents for evaluation of uncontrolled diabetes, chronic neuropathic pain, and hypertension. Jaevian doesn't monitor glucose at home. He inconsistently adheres to current medication regimen. Reports associated chronic foot pain, for which he is currently prescribed Lyrica and previously received tramadol for pain. He continues to struggle with pain, however recently had a positive urine drug screen which was positive for amphetamines. Therefore he is no longer eligible to receive pain medications from practice.  He is closely followed by podiatry for diabetic foot pain management at [facility]. He denies urinary frequency, visual disturbances, polydipsia, or changes in appetite. He engages in no routine exercise although walks frequently as his mode of transportation. Last A1C 7.3. He reports today that he has discontinued taking lisinopril which was prescribed to renal protection secondary to type 2 diabetes. Lisinopril was producing dizziness and fatigue. Denies any angioedema or cough associated with medication. Social History   Socioeconomic History  . Marital status: Widowed    Spouse name: Not on file  . Number of children: Not on file  . Years of education: Not on file  . Highest education level: Not on file  Social Needs  . Financial resource strain: Not on file  . Food insecurity - worry: Not on file  . Food insecurity - inability: Not on file  . Transportation needs - medical: Not on file  . Transportation needs - non-medical: Not on file  Occupational History  . Not on file  Tobacco Use  . Smoking status: Former Smoker    Types: Cigarettes    Start date: 08/18/1970    Last attempt to quit: 10/17/2015    Years since quitting: 1.6  . Smokeless tobacco:  Never Used  . Tobacco comment: 1-2 per day  Substance and Sexual Activity  . Alcohol use: No    Alcohol/week: 0.0 oz    Comment: none since 2016  . Drug use: No    Comment: none since 2016  . Sexual activity: Not on file  Other Topics Concern  . Not on file  Social History Narrative  . Not on file    Family History  Family history unknown: Yes   Review of Systems  Constitutional: negative Cardiovascular: negative Respiratory: negative  Gastrointestinal: negative Musculoskeletal:positive for arthralgias Neurological: positive for foot pain, numbness and tingling   Patient Active Problem List   Diagnosis Date Noted  . Hx of substance abuse 03/05/2017  . Proteinuria 11/11/2016  . Diabetic polyneuropathy (HCC) 09/15/2015  . Diabetes mellitus type II, uncontrolled (HCC) 09/02/2015  . Depression with anxiety 09/02/2015  . Hep C w/o coma, chronic (HCC) 09/02/2015  . 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 Healthcare maintenance 09/02/2015  . Imprisonment and other incarceration 09/02/2015    No Known Allergies  Prior to Admission medications   Medication Sig Start Date End Date Taking? Authorizing Provider  Fluocinonide Emulsified Base 0.05 % CREA APPLY 1 APPLICATION TOPICALLY 2 (TWO) TIMES DAILY. FOR ECZEMA 04/19/17  Yes [name], Miyani Cronic S, FNP  fluocinonide-emollient (LIDEX-E) 0.05 % cream Apply 1 application topically 2 (two) times daily. For eczema 03/16/17  Yes [name], Collen Vincent S, FNP  ketoconazole (NIZORAL) 2 % cream Apply 1 fingertip amount to each foot  daily. 04/28/17  Yes [name], Michael J, DPM  metFORMIN (GLUCOPHAGE) 1000 MG tablet Take 1 tablet (1,000 mg total) by mouth 2 (two) times daily with a meal. 01/31/17  Yes [name], Ikran Patman S, FNP  pregabalin (LYRICA) 200 MG capsule Take 1 capsule (200 mg total) by mouth 3 (three) times daily. 06/22/17  Yes [name], Olugbemiga E, MD  traMADol (ULTRAM) 50 MG tablet Take 1 tablet (50 mg total) every 6  (six) hours as needed by mouth. 05/31/17  Yes [name], Josejuan Hoaglin S, FNP  cephALEXin (KEFLEX) 500 MG capsule Take 1 capsule (500 mg total) 3 (three) times daily by mouth. Patient not taking: Reported on 06/28/2017 05/31/17   [name], Jeremy Mclamb S, FNP  HYDROcodone-acetaminophen (NORCO/VICODIN) 5-325 MG tablet Take 1 tablet by mouth every 4 (four) hours as needed. Patient not taking: Reported on 06/28/2017 02/23/17   [name], David, NP  lisinopril (PRINIVIL,ZESTRIL) 5 MG tablet Take 0.5 tablets (2.5 mg total) by mouth daily. Patient not taking: Reported on 06/28/2017 01/31/17   [name], Millan Legan S, FNP    Past Medical, Surgical Family and Social History reviewed and updated.    Objective:   Today's Vitals   06/28/17 1023  BP: 130/70  Pulse: 88  Resp: 14  Temp: 98.2 F (36.8 C)  TempSrc: Oral  SpO2: 100%  Weight: 180 lb 6.4 oz (81.8 kg)  Height: 5\' 9"  (1.753 m)    Wt Readings from Last 3 Encounters:  06/28/17 180 lb 6.4 oz (81.8 kg)  05/31/17 171 lb 3.2 oz (77.7 kg)  03/28/17 151 lb 6.4 oz (68.7 kg)    Physical Exam Constitutional: Patient appears well-developed and well-nourished. No distress. HENT: Normocephalic, atraumatic, External right and left ear normal. Oropharynx is clear and moist.  Eyes: Conjunctivae and EOM are normal. PERRLA, no scleral icterus. Neck: Normal ROM. Neck supple. No JVD. No tracheal deviation. No thyromegaly. CVS: RRR, S1/S2 +, no murmurs, no gallops, no carotid bruit.  Pulmonary: Effort and breath sounds normal, no stridor, rhonchi, wheezes, rales.  Abdominal: Soft. BS +, no distension, tenderness, rebound or guarding.  Musculoskeletal: Normal range of motion. No edema and no tenderness.  Lymphadenopathy: No lymphadenopathy noted, cervical, inguinal or axillary Neuro: Alert and Oriented.  Antalgic gait and w/cane for mobility assistance. Skin: Skin is warm and dry. No rash noted. Not diaphoretic. No erythema. No pallor. Psychiatric: Normal mood and affect.  Behavior, judgment, thought content normal.   Assessment & Plan:  1. Type 2 diabetes mellitus without complication, without long-term current use of insulin (HCC), A1C today 7.1. Stable. -Continue metformin  -Aim for at least 15-20 minutes of physical activity as tolerated 3-5 days per week -Continue efforts for modified carbohydrate diet.  2. Other chronic pain, neuropathic foot pain, currently non managed by prescribed neuropathic pain medication. Resuming Cymbalta in efforts to help manage pain.  Referring to pain management for further evaluation and treatment.   - Ambulatory referral to Pain Clinic  3. Hx of substance abuse, recent UDS was positive for amphetamines. -patient self reports abstinence at present, denies this is an active problem     Orders Placed This Encounter  Procedures  . Ambulatory referral to Pain Clinic  . POCT glycosylated hemoglobin (Hb A1C)  . POCT urinalysis dip (device)   Meds ordered this encounter  Medications  . gabapentin (NEURONTIN) 300 MG capsule    Sig: Take 1 capsule (300 mg total) by mouth 3 (three) times daily.    Dispense:  90 capsule    Refill:  3  Order Specific Question:   Supervising Provider    Answer:   Quentin Angst [id number]  . DULoxetine (CYMBALTA) 60 MG capsule    Sig: Take 1 capsule (60 mg total) by mouth daily.    Dispense:  90 capsule    Refill:  2    Order Specific Question:   Supervising Provider    Answer:   Quentin Angst [id number]    RTC: 6 months for chronic conditions and A1c check     Godfrey Pick. Tiburcio Pea, MSN, FNP-C [facility]  [address] [place], [place] [address] [phone]

## 2017-06-28 NOTE — Patient Instructions (Signed)
You have been referred to pain management. If you have not been contacted within 7-10 day, please follow-up here at the office.   Diabetes Mellitus and Food It is important for you to manage your blood sugar (glucose) level. Your blood glucose level can be greatly affected by what you eat. Eating healthier foods in the appropriate amounts throughout the day at about the same time each day will help you control your blood glucose level. It can also help slow or prevent worsening of your diabetes mellitus. Healthy eating may even help you improve the level of your blood pressure and reach or maintain a healthy weight. General recommendations for healthful eating and cooking habits include:  Eating meals and snacks regularly. Avoid going long periods of time without eating to lose weight.  Eating a diet that consists mainly of plant-based foods, such as fruits, vegetables, nuts, legumes, and whole grains.  Using low-heat cooking methods, such as baking, instead of high-heat cooking methods, such as deep frying.  Work with your dietitian to make sure you understand how to use the Nutrition Facts information on food labels. How can food affect me? Carbohydrates Carbohydrates affect your blood glucose level more than any other type of food. Your dietitian will help you determine how many carbohydrates to eat at each meal and teach you how to count carbohydrates. Counting carbohydrates is important to keep your blood glucose at a healthy level, especially if you are using insulin or taking certain medicines for diabetes mellitus. Alcohol Alcohol can cause sudden decreases in blood glucose (hypoglycemia), especially if you use insulin or take certain medicines for diabetes mellitus. Hypoglycemia can be a life-threatening condition. Symptoms of hypoglycemia (sleepiness, dizziness, and disorientation) are similar to symptoms of having too much alcohol. If your health care provider has given you approval to  drink alcohol, do so in moderation and use the following guidelines:  Women should not have more than one drink per day, and men should not have more than two drinks per day. One drink is equal to: ? 12 oz of beer. ? 5 oz of wine. ? 1 oz of hard liquor.  Do not drink on an empty stomach.  Keep yourself hydrated. Have water, diet soda, or unsweetened iced tea.  Regular soda, juice, and other mixers might contain a lot of carbohydrates and should be counted.  What foods are not recommended? As you make food choices, it is important to remember that all foods are not the same. Some foods have fewer nutrients per serving than other foods, even though they might have the same number of calories or carbohydrates. It is difficult to get your body what it needs when you eat foods with fewer nutrients. Examples of foods that you should avoid that are high in calories and carbohydrates but low in nutrients include:  Trans fats (most processed foods list trans fats on the Nutrition Facts label).  Regular soda.  Juice.  Candy.  Sweets, such as cake, pie, doughnuts, and cookies.  Fried foods.  What foods can I eat? Eat nutrient-rich foods, which will nourish your body and keep you healthy. The food you should eat also will depend on several factors, including:  The calories you need.  The medicines you take.  Your weight.  Your blood glucose level.  Your blood pressure level.  Your cholesterol level.  You should eat a variety of foods, including:  Protein. ? Lean cuts of meat. ? Proteins low in saturated fats, such as fish, egg  whites, and beans. Avoid processed meats.  Fruits and vegetables. ? Fruits and vegetables that may help control blood glucose levels, such as apples, mangoes, and yams.  Dairy products. ? Choose fat-free or low-fat dairy products, such as milk, yogurt, and cheese.  Grains, bread, pasta, and rice. ? Choose whole grain products, such as multigrain  bread, whole oats, and brown rice. These foods may help control blood pressure.  Fats. ? Foods containing healthful fats, such as nuts, avocado, olive oil, canola oil, and fish.  Does everyone with diabetes mellitus have the same meal plan? Because every person with diabetes mellitus is different, there is not one meal plan that works for everyone. It is very important that you meet with a dietitian who will help you create a meal plan that is just right for you. This information is not intended to replace advice given to you by your health care provider. Make sure you discuss any questions you have with your health care provider. Document Released: 03/31/2005 Document Revised: 12/10/2015 Document Reviewed: 05/31/2013 Elsevier Interactive Patient Education  2017 Elsevier Inc.         Chronic Pain, Adult Chronic pain is a type of pain that lasts or keeps coming back (recurs) for at least six months. You may have chronic headaches, abdominal pain, or body pain. Chronic pain may be related to an illness, such as fibromyalgia or complex regional pain syndrome. Sometimes the cause of chronic pain is not known. Chronic pain can make it hard for you to do daily activities. If not treated, chronic pain can lead to other health problems, including anxiety and depression. Treatment depends on the cause and severity of your pain. You may need to work with a pain specialist to come up with a treatment plan. The plan may include medicine, counseling, and physical therapy. Many people benefit from a combination of two or more types of treatment to control their pain. Follow these instructions at home: Lifestyle  Consider keeping a pain diary to share with your health care providers.  Consider talking with a mental health care provider (psychologist) about how to cope with chronic pain.  Consider joining a chronic pain support group.  Try to control or lower your stress levels. Talk to your health  care provider about strategies to do this. General instructions   Take over-the-counter and prescription medicines only as told by your health care provider.  Follow your treatment plan as told by your health care provider. This may include: ? Gentle, regular exercise. ? Eating a healthy diet that includes foods such as vegetables, fruits, fish, and lean meats. ? Cognitive or behavioral therapy. ? Working with a Adult nursephysical therapist. ? Meditation or yoga. ? Acupuncture or massage therapy. ? Aroma, color, light, or sound therapy. ? Local electrical stimulation. ? Shots (injections) of numbing or pain-relieving medicines into the spine or the area of pain.  Check your pain level as told by your health care provider. Ask your health care provider if you should use a pain scale.  Learn as much as you can about how to manage your chronic pain. Ask your health care provider if an intensive pain rehabilitation program or a chronic pain specialist would be helpful.  Keep all follow-up visits as told by your health care provider. This is important. Contact a health care provider if:  Your pain gets worse.  You have new pain.  You have trouble sleeping.  You have trouble doing your normal activities.  Your pain is  not controlled with treatment.  Your have side effects from pain medicine.  You feel weak. Get help right away if:  You lose feeling or have numbness in your body.  You lose control of bowel or bladder function.  Your pain suddenly gets much worse.  You develop shaking or chills.  You develop confusion.  You develop chest pain.  You have trouble breathing or shortness of breath.  You pass out.  You have thoughts about hurting yourself or others. This information is not intended to replace advice given to you by your health care provider. Make sure you discuss any questions you have with your health care provider. Document Released: 03/26/2002 Document Revised:  03/03/2016 Document Reviewed: 12/22/2015 Elsevier Interactive Patient Education  2017 [employer]

## 2017-06-29 ENCOUNTER — Ambulatory Visit: Payer: No Typology Code available for payment source

## 2017-06-29 ENCOUNTER — Telehealth: Payer: Self-pay | Admitting: Family Medicine

## 2017-06-29 MED ORDER — DULOXETINE HCL 60 MG PO CPEP: 60.0000 mg | ORAL_CAPSULE | Freq: Every day | ORAL | 2 refills | Status: AC

## 2017-06-29 NOTE — Telephone Encounter (Signed)
Office note complete. Please refer patient to [facility].  Godfrey PickKimberly S. Tiburcio PeaHarris, MSN, FNP-C [facility]  [address] [place], [place] [address] [phone]

## 2017-06-30 NOTE — Telephone Encounter (Signed)
[facility]

## 2017-07-03 ENCOUNTER — Ambulatory Visit: Payer: Medicaid Other | Admitting: Pharmacist Clinician (PhC)/ Clinical Pharmacy Specialist

## 2017-07-03 DIAGNOSIS — B182 Chronic viral hepatitis C: Secondary | ICD-10-CM

## 2017-07-03 NOTE — Progress Notes (Signed)
HPI: William GauzeRobert J Ware is a 61 y.o. male who is here to see pharmacy for his hep B vaccine.   Lab Results  Component Value Date   HCVGENOTYPE 1a 01/03/2017    Allergies: Allergies  Allergen Reactions  . Lisinopril     No allergy  Only intolerance. Medication produces dizziness    Vitals:    Past Medical History: Past Medical History:  Diagnosis Date  . Chronic hepatitis C (HCC)   . Depression   . Diabetes mellitus without complication (HCC)   . Diabetic peripheral neuropathy associated with type 2 diabetes mellitus (HCC)     Social History: Social History   Socioeconomic History  . Marital status: Widowed    Spouse name: Not on file  . Number of children: Not on file  . Years of education: Not on file  . Highest education level: Not on file  Social Needs  . Financial resource strain: Not on file  . Food insecurity - worry: Not on file  . Food insecurity - inability: Not on file  . Transportation needs - medical: Not on file  . Transportation needs - non-medical: Not on file  Occupational History  . Not on file  Tobacco Use  . Smoking status: Former Smoker    Types: Cigarettes    Start date: 08/18/1970    Last attempt to quit: 10/17/2015    Years since quitting: 1.7  . Smokeless tobacco: Never Used  . Tobacco comment: 1-2 per day  Substance and Sexual Activity  . Alcohol use: No    Alcohol/week: 0.0 oz    Comment: none since 2016  . Drug use: No    Comment: none since 2016  . Sexual activity: Not on file  Other Topics Concern  . Not on file  Social History Narrative  . Not on file    Labs: Hep B S Ab (no units)  Date Value  01/03/2017 NEG   Hepatitis B Surface Ag (no units)  Date Value  01/03/2017 NEGATIVE    Lab Results  Component Value Date   HCVGENOTYPE 1a 01/03/2017    Hepatitis C RNA quantitative Latest Ref Rng & Units 05/24/2017 02/23/2017  HCV Quantitative NOT DETECTED IU/mL - <15 DETECTED(A)  HCV Quantitative Log NOT DETECT Log IU/mL  <1.18 NOT DETECTED <1.18 DETECTED(A)    AST  Date Value  05/24/2017 19 U/L  11/11/2016 28 U/L  09/02/2015 48 IU/L (H)   SGOT(AST) (Unit/L)  Date Value  11/24/2013 29   ALT  Date Value  05/24/2017 10 U/L  01/03/2017 39 U/L  11/11/2016 24 U/L  09/02/2015 52 IU/L (H)   SGPT (ALT) (U/L)  Date Value  11/24/2013 24   INR (no units)  Date Value  01/03/2017 1.1    CrCl: CrCl cannot be calculated (Patient's most recent lab result is older than the maximum 21 days allowed.).  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 He only needs the 2nd hep B vaccine. His EOT VL came back undetectable. He will come back for his SVR12 in Feb then see Dr. Luciana Axeomer for the cure visit. Dr. Luciana Axeomer will see if he needs to get an US.   Recommendations: Second hep B today SVR12 then cure with Dr. Luciana Axeomer F/u for 3rd hep B in May  [name], Vermont[place]arm.D.,  BCPS, AAHIVP Clinical Infectious Disease Pharmacist [facility] 07/03/2017, 3:10 PM  This encounter was created in error - please disregard.

## 2017-07-03 NOTE — Progress Notes (Signed)
Duplicate

## 2017-07-04 ENCOUNTER — Encounter
Payer: No Typology Code available for payment source | Admitting: Pharmacist Clinician (PhC)/ Clinical Pharmacy Specialist

## 2017-07-26 MED FILL — NARCAN 4 MG NASAL SPRAY: 4 | 1 days supply | Qty: 2 | Fill #0

## 2017-07-31 MED FILL — ?DULOXETINE HCL 60 MG CPEP: 60 | 30 days supply | Qty: 30 | Fill #3

## 2017-07-31 MED FILL — ?METFORMIN HCL 1,000 MG TAB: 1000 | 30 days supply | Qty: 60 | Fill #3

## 2017-07-31 MED FILL — FLUOCINONIDE EMULSIFIED BAS: 0.05 | 15 days supply | Qty: 30 | Fill #3

## 2017-08-18 MED FILL — FLUOCINONIDE EMULSIFIED BAS: 0.05 | 15 days supply | Qty: 30 | Fill #4

## 2017-08-24 ENCOUNTER — Other Ambulatory Visit: Payer: Medicaid Other

## 2017-08-24 DIAGNOSIS — B182 Chronic viral hepatitis C: Secondary | ICD-10-CM

## 2017-08-24 LAB — COMPLETE METABOLIC PANEL WITH GFR
AG RATIO: 1.1 (calc) (ref 1.0–2.5)
ALT: 9 U/L (ref 9–46)
AST: 17 U/L (ref 10–35)
Albumin: 3.8 g/dL (ref 3.6–5.1)
Alkaline phosphatase (APISO): 48 U/L (ref 40–115)
BUN: 15 mg/dL (ref 7–25)
CALCIUM: 9.3 mg/dL (ref 8.6–10.3)
CO2: 22 mmol/L (ref 20–32)
CREATININE: 1.16 mg/dL (ref 0.70–1.25)
Chloride: 108 mmol/L (ref 98–110)
GFR, EST AFRICAN AMERICAN: 79 mL/min/{1.73_m2} (ref >=60)
GFR, EST NON AFRICAN AMERICAN: 68 mL/min/{1.73_m2} (ref >=60)
GLOBULIN: 3.5 g/dL (ref 1.9–3.7)
Glucose, Bld: 178 mg/dL — ABNORMAL HIGH (ref 65–99)
POTASSIUM: 4.5 mmol/L (ref 3.5–5.3)
Sodium: 139 mmol/L (ref 135–146)
TOTAL PROTEIN: 7.3 g/dL (ref 6.1–8.1)
Total Bilirubin: 0.3 mg/dL (ref 0.2–1.2)

## 2017-08-24 NOTE — Addendum Note (Signed)
Addended byJimmy Picket: Ware, William F on: 08/24/2017 09:20 AM   Modules accepted: Orders

## 2017-08-24 NOTE — Addendum Note (Signed)
Addended by: ABBITT, KATRINA F on: 08/24/2017 09:19 AM ° ° Modules accepted: Orders ° °

## 2017-08-24 NOTE — Addendum Note (Signed)
Addended byJimmy Picket: ABBITT, KATRINA F on: 08/24/2017 09:19 AM   Modules accepted: Orders

## 2017-08-26 LAB — HEPATITIS C RNA QUANTITATIVE
HCV Quantitative Log: <1.18 Log IU/mL
HCV RNA, PCR, QN: <15 IU/mL

## 2017-08-29 ENCOUNTER — Ambulatory Visit: Payer: Medicaid Other | Admitting: Family Medicine

## 2017-08-31 ENCOUNTER — Ambulatory Visit (INDEPENDENT_AMBULATORY_CARE_PROVIDER_SITE_OTHER): Payer: Medicaid Other | Admitting: Internal Medicine

## 2017-08-31 ENCOUNTER — Encounter: Payer: Self-pay | Admitting: Internal Medicine

## 2017-08-31 VITALS — BP 138/77 | HR 86 | Temp 98.0°F | Ht 69.0 in | Wt 179.0 lb

## 2017-08-31 DIAGNOSIS — K7400 Hepatic fibrosis, unspecified: Secondary | ICD-10-CM

## 2017-08-31 DIAGNOSIS — Z87898 Personal history of other specified conditions: Secondary | ICD-10-CM

## 2017-08-31 DIAGNOSIS — Z7185 Encounter for immunization safety counseling: Secondary | ICD-10-CM | POA: Insufficient documentation

## 2017-08-31 DIAGNOSIS — B182 Chronic viral hepatitis C: Secondary | ICD-10-CM

## 2017-08-31 DIAGNOSIS — Z7189 Other specified counseling: Secondary | ICD-10-CM | POA: Diagnosis not present

## 2017-08-31 DIAGNOSIS — F1911 Other psychoactive substance abuse, in remission: Secondary | ICD-10-CM

## 2017-08-31 NOTE — Progress Notes (Signed)
   Subjective:    Patient ID: William Ware, male    DOB: [birth year], 62 y.o.   MRN: [id number]  HPI He comes in for follow up of HCV He was treated with Harvoni and completed 12 weeks about 3 months ago.  No issues during treatment.  EOT lab and now SVR12 undetectable confirming cure.  He was not able to get an elastography previously due to no coverage but now has medicaid.  F3 on Fibrosure, platelets > 150,000.  He is getting the hepatitis B series and has completed 2.  No fatigue.     Review of Systems  Constitutional: Negative for fatigue.  Eyes: Negative for visual disturbance.  Neurological: Negative for dizziness.       Objective:   Physical Exam  Constitutional: He appears well-developed and well-nourished. No distress.  HENT:  Mouth/Throat: No oropharyngeal exudate.  Eyes: No scleral icterus.  Cardiovascular: Normal rate, regular rhythm and normal heart sounds.  No murmur heard. Pulmonary/Chest: Effort normal and breath sounds normal. No respiratory distress.  Lymphadenopathy:    He has no cervical adenopathy.  Skin: No rash noted.   SH: no alcohol now       Assessment & Plan:

## 2017-08-31 NOTE — Assessment & Plan Note (Signed)
Will check elastography now that he has coverage.  He will follow up after test for the result.

## 2017-08-31 NOTE — Assessment & Plan Note (Signed)
Encouraged continued abstinence. 

## 2017-08-31 NOTE — Assessment & Plan Note (Signed)
Will need hepatitis B vaccine #3 in about May.

## 2017-08-31 NOTE — Assessment & Plan Note (Signed)
Now considered cured.  

## 2017-09-05 ENCOUNTER — Encounter: Payer: Self-pay | Admitting: Family Medicine

## 2017-09-05 ENCOUNTER — Ambulatory Visit (INDEPENDENT_AMBULATORY_CARE_PROVIDER_SITE_OTHER): Payer: Medicaid Other | Admitting: Family Medicine

## 2017-09-05 VITALS — BP 136/84 | HR 80 | Temp 98.9°F | Resp 16 | Ht 69.0 in | Wt 179.0 lb

## 2017-09-05 DIAGNOSIS — L309 Dermatitis, unspecified: Secondary | ICD-10-CM

## 2017-09-05 DIAGNOSIS — L2082 Flexural eczema: Secondary | ICD-10-CM

## 2017-09-05 MED ORDER — METHYLPREDNISOLONE SODIUM SUCC 125 MG IJ SOLR
125.0000 mg | Freq: Once | INTRAMUSCULAR | Status: AC
  Administered 2017-09-05: 125 mg via INTRAMUSCULAR

## 2017-09-05 MED ORDER — TRIAMCINOLONE ACETONIDE 0.025 % EX CREA: 1.0000 "application " | TOPICAL_CREAM | Freq: Two times a day (BID) | CUTANEOUS | 1 refills | Status: AC

## 2017-09-05 MED ORDER — PREDNISONE 20 MG PO TABS: 40.0000 mg | ORAL_TABLET | Freq: Every day | ORAL | 0 refills | Status: DC

## 2017-09-05 MED FILL — TRIAMCINOLONE 0.025% CREAM: 0.025 | 20 days supply | Qty: 80 | Fill #0

## 2017-09-05 MED FILL — predniSONE 20 MG TABS: 20 | 5 days supply | Qty: 10 | Fill #0

## 2017-09-05 NOTE — Progress Notes (Signed)
Patient ID: William Ware, male    DOB: [birth year], 62 y.o.   MRN: [id number]  PCP: William Ware, William Sovine Ware, Ware  Chief Complaint  Patient presents with  . Rash    arms, hands,back,legs    Subjective:  HPI William Ware is a 62 y.o. male with history of chronic dermatitis presents for evaluation of worsening skin dermatitis. He has previously managed dermatitis with topical steriodal. He reports over the course of last few weeks, dermatitis rash has worsened and extended bilateral arms, low back, and legs. The rash itches continuously. He is requesting a referral to dermatology as he was previously followed by dermatology. Social History   Socioeconomic History  . Marital status: Widowed    Spouse name: Not on file  . Number of children: Not on file  . Years of education: Not on file  . Highest education level: Not on file  Social Needs  . Financial resource strain: Not on file  . Food insecurity - worry: Not on file  . Food insecurity - inability: Not on file  . Transportation needs - medical: Not on file  . Transportation needs - non-medical: Not on file  Occupational History  . Not on file  Tobacco Use  . Smoking status: Former Smoker    Types: Cigarettes    Start date: 08/18/1970    Last attempt to quit: 10/17/2015    Years since quitting: 1.8  . Smokeless tobacco: Never Used  . Tobacco comment: 1-2 per day  Substance and Sexual Activity  . Alcohol use: No    Alcohol/week: 0.0 oz    Comment: none since 2016  . Drug use: No    Comment: none since 2016  . Sexual activity: Not on file  Other Topics Concern  . Not on file  Social History Narrative  . Not on file    Family History  Problem Relation Age of Onset  . Diabetes Neg Hx   . Stroke Neg Hx   . Hypertension Neg Hx    Review of Systems  Constitutional: Positive for fatigue.  Respiratory: Negative.   Cardiovascular: Negative.   Musculoskeletal: Negative.   Skin: Positive for color change and rash.   Neurological: Negative.   Psychiatric/Behavioral: Negative.     Patient Active Problem List   Diagnosis Date Noted  . Liver fibrosis 08/31/2017  . Vaccine counseling 08/31/2017  . Hx of substance abuse 03/05/2017  . Proteinuria 11/11/2016  . Diabetic polyneuropathy (HCC) 09/15/2015  . Diabetes mellitus type II, uncontrolled (HCC) 09/02/2015  . Depression with anxiety 09/02/2015  . Hep C w/o coma, chronic (HCC) 09/02/2015  . 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 Healthcare maintenance 09/02/2015  . Imprisonment and other incarceration 09/02/2015    Allergies  Allergen Reactions  . Lisinopril     No allergy  Only intolerance. Medication produces dizziness    Prior to Admission medications   Medication Sig Start Date End Date Taking? Authorizing Provider  DULoxetine (CYMBALTA) 60 MG capsule Take 1 capsule (60 mg total) by mouth daily. 06/29/17  Yes William Ware, William Rafter Ware, Ware  Fluocinonide Emulsified Base 0.05 % CREA APPLY 1 APPLICATION TOPICALLY 2 (TWO) TIMES DAILY. FOR ECZEMA 04/19/17  Yes William Ware, Tyliah Schlereth Ware, Ware  HYDROcodone-acetaminophen (NORCO/VICODIN) 5-325 MG tablet Take 1 tablet by mouth every 4 (four) hours as needed. 02/23/17  Yes William Ware, William Huaavid, NP  metFORMIN (GLUCOPHAGE) 1000 MG tablet Take 1 tablet (1,000 mg total) by mouth 2 (two) times daily with  a meal. 01/31/17  Yes William Ware  pregabalin (LYRICA) 200 MG capsule Take 1 capsule (200 mg total) by mouth 3 (three) times daily. 06/22/17  Yes William Angst, MD  XTAMPZA ER 9 MG C12A Take 9 mg by mouth 2 (two) times daily. 08/07/17  Yes [provider]  fluocinonide-emollient (LIDEX-E) 0.05 % cream Apply 1 application topically 2 (two) times daily. For eczema Patient not taking: Reported on 08/31/2017 03/16/17   William Ware  gabapentin (NEURONTIN) 300 MG capsule Take 1 capsule (300 mg total) by mouth 3 (three) times daily. Patient not taking: Reported on 08/31/2017  06/28/17   William Ware  ketoconazole (NIZORAL) 2 % cream Apply 1 fingertip amount to each foot daily. Patient not taking: Reported on 09/05/2017 04/28/17   William Ware, DPM    Past Medical, Surgical Family and Social History reviewed and updated.    Objective:   Today'Ware Vitals   09/05/17 1543  BP: 136/84  Pulse: 80  Resp: 16  Temp: 98.9 F (37.2 C)  TempSrc: Oral  SpO2: 100%  Weight: 179 lb (81.2 kg)  Height: 5\' 9"  (1.753 m)    Wt Readings from Last 3 Encounters:  09/05/17 179 lb (81.2 kg)  08/31/17 179 lb (81.2 kg)  06/28/17 180 lb 6.4 oz (81.8 kg)    Physical Exam  Constitutional: He appears well-developed and well-nourished.  HENT:  Head: Normocephalic and atraumatic.  Cardiovascular: Normal rate, regular rhythm, normal heart sounds and intact distal pulses.  Pulmonary/Chest: Effort normal and breath sounds normal.  Musculoskeletal: Normal range of motion.  Lymphadenopathy:    He has no cervical adenopathy.  Neurological: He is alert.  Skin: Skin is warm and dry. Rash noted. There is erythema.  Psychiatric: He has a normal mood and affect. His behavior is normal. Judgment and thought content normal.   Assessment & Plan:  1. Dermatitis, chronic, administer one time dose methylPREDNISolone sodium succinate (SOLU-MEDROL) 125 mg/2 ml.  Ambulatory referral to Dermatology, to Dermatologist Specialist.   Meds ordered this encounter  Medications  . methylPREDNISolone sodium succinate (SOLU-MEDROL) 125 mg/2 mL injection 125 mg  . predniSONE (DELTASONE) 20 MG tablet    Sig: Take 2 tablets (40 mg total) by mouth daily with breakfast.    Dispense:  10 tablet    Refill:  0    Order Specific Question:   Supervising Provider    Answer:   William Ware [id number]  . triamcinolone (KENALOG) 0.025 % cream    Sig: Apply 1 application topically 2 (two) times daily.    Dispense:  454 g    Refill:  1    Order Specific Question:   Supervising Provider     Answer:   William Ware [id number]  . RTC: At scheduled follow-up!   Godfrey Pick. William Pea, MSN, Ware-C [facility]  [address] [place] Maroon [place], [place] [address] [phone]

## 2017-09-05 NOTE — Patient Instructions (Signed)
You are being referred to [phone]  Start prednisone tomorrow 40 mg once daily with breakfast for 5 days.  I have re-ordered you stronger cream for eczema.    Eczema Eczema is a broad term for a group of skin conditions that cause skin to become rough and inflamed. Each type of eczema has different triggers, symptoms, and treatments. Eczema of any type is usually itchy and symptoms range from mild to severe. Eczema and its symptoms are not spread from person to person (are not contagious). It can appear on different parts of the body at different times. Your eczema may not look the same as someone else's eczema. What are the types of eczema? Atopic dermatitis This is a long-term (chronic) skin disease that keeps coming back (recurring). Usual symptoms are dry skin and small, solid pimples that may swell and leak fluid (weep). Contact dermatitis This happens when something irritates the skin and causes a rash. The irritation can come from substances that you are allergic to (allergens), such as poison ivy, chemicals, or medicines that were applied to your skin. Dyshidrotic eczema This is a form of eczema on the hands and feet. It shows up as very itchy, fluid-filled blisters. It can affect people of any age, but is more common before age 61. Hand eczema This causes very itchy areas of skin on the palms and sides of the hands and fingers. This type of eczema is common in industrial jobs where you may be exposed to many different types of irritants. Lichen simplex chronicus This type of eczema occurs when a person constantly scratches one area of the body. Repeated scratching of the area leads to thickened skin (lichenification). Lichen simplex chronicus can occur along with other types of eczema. It is more common in adults, but may be seen in children as well. Nummular eczema This is a common type of eczema. It has no known cause. It typically causes a red,  circular, crusty lesion (plaque) that may be itchy. Scratching may become a habit and can cause bleeding. Nummular eczema occurs most often in people of middle-age or older. It most often affects the hands. Seborrheic dermatitis This is a common skin disease that mainly affects the scalp. It may also affect any oily areas of the body, such as the face, sides of nose, eyebrows, ears, eyelids, and chest. It is marked by small scaling and redness of the skin (erythema). This can affect people of all ages. In infants, this condition is known as Location manager"cradle cap." Stasis dermatitis This is a common skin disease that usually appears on the legs and feet. It most often occurs in people who have a condition that prevents blood from being pumped through the veins in the legs (chronic venous insufficiency). Stasis dermatitis is a chronic condition that needs long-term management. How is eczema diagnosed? Your health care provider will examine your skin and review your medical history. He or she may also give you skin patch tests. These tests involve taking patches that contain possible allergens and placing them on your back. He or she will then check in a few days to see if an allergic reaction occurred. What are the common treatments? Treatment for eczema is based on the type of eczema you have. Hydrocortisone steroid medicine can relieve itching quickly and help reduce inflammation. This medicine may be prescribed or obtained over-the-counter, depending on the strength of the medicine that is needed. Follow these instructions at home:  Take over-the-counter and prescription medicines only  as told by your health care provider.  Use creams or ointments to moisturize your skin. Do not use lotions.  Learn what triggers or irritates your symptoms. Avoid these things.  Treat symptom flare-ups quickly.  Do not itch your skin. This can make your rash worse.  Keep all follow-up visits as told by your health care  provider. This is important. Where to find more information:  The American Academy of Dermatology: InfoExam.si  The National Eczema Association: www.nationaleczema.org Contact a health care provider if:  You have serious itching, even with treatment.  You regularly scratch your skin until it bleeds.  Your rash looks different than usual.  Your skin is painful, swollen, or more red than usual.  You have a fever. Summary  There are eight general types of eczema. Each type has different triggers.  Eczema of any type causes itching that may range from mild to severe.  Treatment varies based on the type of eczema you have. Hydrocortisone steroid medicine can help with itching and inflammation.  Protecting your skin is the best way to prevent eczema. Use moisturizers and lotions. Avoid triggers and irritants, and treat flare-ups quickly. This information is not intended to replace advice given to you by your health care provider. Make sure you discuss any questions you have with your health care provider. Document Released: 11/17/2016 Document Revised: 11/17/2016 Document Reviewed: 11/17/2016 Elsevier Interactive Patient Education  2018 [employer].

## 2017-09-06 ENCOUNTER — Ambulatory Visit (HOSPITAL_COMMUNITY): Payer: Medicaid Other

## 2017-09-11 ENCOUNTER — Ambulatory Visit (HOSPITAL_COMMUNITY): Payer: Medicaid Other

## 2017-09-14 ENCOUNTER — Ambulatory Visit (HOSPITAL_COMMUNITY): Payer: Medicaid Other

## 2017-09-14 MED FILL — metFORMIN HCL 1000 MG TABS: 1000 | 30 days supply | Qty: 60 | Fill #4

## 2017-09-19 ENCOUNTER — Telehealth: Payer: Self-pay

## 2017-09-20 MED FILL — TRIAMCINOLONE 0.025% CREAM: 0.025 | 20 days supply | Qty: 80 | Fill #1

## 2017-09-20 NOTE — Telephone Encounter (Signed)
Patient will give me a callback with name of dermatologist he is able to see with his medicaid

## 2017-09-21 ENCOUNTER — Ambulatory Visit (HOSPITAL_COMMUNITY): Payer: Medicaid Other

## 2017-09-29 ENCOUNTER — Ambulatory Visit (HOSPITAL_COMMUNITY): Payer: Medicaid Other

## 2017-10-05 ENCOUNTER — Encounter: Payer: Self-pay | Admitting: Internal Medicine

## 2017-10-05 ENCOUNTER — Ambulatory Visit (INDEPENDENT_AMBULATORY_CARE_PROVIDER_SITE_OTHER): Payer: Medicaid Other | Admitting: Internal Medicine

## 2017-10-05 VITALS — BP 157/77 | HR 76 | Temp 97.9°F | Ht 69.0 in | Wt 182.0 lb

## 2017-10-05 DIAGNOSIS — K7400 Hepatic fibrosis, unspecified: Secondary | ICD-10-CM

## 2017-10-05 DIAGNOSIS — B182 Chronic viral hepatitis C: Secondary | ICD-10-CM | POA: Diagnosis present

## 2017-10-05 NOTE — Progress Notes (Signed)
   Subjective:    Patient ID: William Ware, male    DOB: 1955-07-24, 62 y.o.   MRN: [id number]  HPI He comes in for follow up of HCV He was treated with Harvoni and completed 12 weeks and SVR 12 negative confirming cure. He was again not able to get an elastography and has canceled/no showed numerous appointments.  F3 on Fibrosure, platelets > 150,000.  He is getting the hepatitis B series and has completed 2.    Review of Systems  Constitutional: Negative for fatigue.  Eyes: Negative for visual disturbance.  Neurological: Negative for dizziness.       Objective:   Physical Exam  Constitutional: He appears well-developed and well-nourished. No distress.  HENT:  Mouth/Throat: No oropharyngeal exudate.  Eyes: No scleral icterus.  Cardiovascular: Normal rate, regular rhythm and normal heart sounds.  No murmur heard. Pulmonary/Chest: Effort normal and breath sounds normal. No respiratory distress.  Lymphadenopathy:    He has no cervical adenopathy.  Skin: No rash noted.   SH: no alcohol now       Assessment & Plan:

## 2017-10-05 NOTE — Assessment & Plan Note (Signed)
F3 on Fibrosure.  After mulitple missed appts, he will call to reschedule and follow up with me in 4 weeks.

## 2017-10-05 NOTE — Assessment & Plan Note (Signed)
Now considered cured.  

## 2017-10-17 MED FILL — metFORMIN HCL 1000 MG TABS: 1000 | 30 days supply | Qty: 60 | Fill #5

## 2017-10-17 MED FILL — TRIAMCINOLONE 0.025% CREAM: 0.025 | 20 days supply | Qty: 80 | Fill #2

## 2017-10-18 ENCOUNTER — Ambulatory Visit (HOSPITAL_COMMUNITY)
Admission: RE | Admit: 2017-10-18 | Discharge: 2017-10-18 | Disposition: A | Discharge status: Home / Self Care | Payer: Medicaid Other | Source: Ambulatory Visit | Attending: Internal Medicine | Admitting: Internal Medicine

## 2017-10-18 DIAGNOSIS — B182 Chronic viral hepatitis C: Secondary | ICD-10-CM | POA: Insufficient documentation

## 2017-11-14 ENCOUNTER — Encounter: Payer: Self-pay | Admitting: Internal Medicine

## 2017-11-14 ENCOUNTER — Ambulatory Visit (INDEPENDENT_AMBULATORY_CARE_PROVIDER_SITE_OTHER): Payer: Medicaid Other | Admitting: Internal Medicine

## 2017-11-14 DIAGNOSIS — K7400 Hepatic fibrosis, unspecified: Secondary | ICD-10-CM

## 2017-11-14 DIAGNOSIS — Z23 Encounter for immunization: Secondary | ICD-10-CM | POA: Diagnosis not present

## 2017-11-14 DIAGNOSIS — Z7189 Other specified counseling: Secondary | ICD-10-CM | POA: Diagnosis not present

## 2017-11-14 DIAGNOSIS — Z7185 Encounter for immunization safety counseling: Secondary | ICD-10-CM

## 2017-11-14 NOTE — Assessment & Plan Note (Signed)
Minimal fibrosis on elastography.  No HCC screening indicated.  Can rtc prn

## 2017-11-14 NOTE — Assessment & Plan Note (Signed)
Will complete the hepatitis B series today.

## 2017-11-14 NOTE — Progress Notes (Signed)
   Subjective:    Patient ID: William Ware, male    DOB: 1956/01/17, 62 y.o.   MRN: [id number]  HPI He comes in for follow up of HCV  He was treated with Harvoni and completed 12 weeks and SVR 12 negative confirming cure.  F3 on Fibrosure, platelets > 150,000.  He is getting the hepatitis B series and has completed 2.  He finally was able to get his elastography and is c/w F0/1.  No new issues.  Continues to maintain a clean life.  Is grateful for the care and tearful with the good news.     Review of Systems  Constitutional: Negative for fatigue.  Eyes: Negative for visual disturbance.  Neurological: Negative for dizziness.       Objective:   Physical Exam  Constitutional: He appears well-developed and well-nourished. No distress.  HENT:  Mouth/Throat: No oropharyngeal exudate.  Eyes: No scleral icterus.  Cardiovascular: Normal rate, regular rhythm and normal heart sounds.  No murmur heard. Pulmonary/Chest: Effort normal and breath sounds normal. No respiratory distress.  Lymphadenopathy:    He has no cervical adenopathy.  Skin: No rash noted.   SH: no alcohol now       Assessment & Plan:

## 2017-11-21 ENCOUNTER — Ambulatory Visit: Payer: No Typology Code available for payment source

## 2017-11-24 ENCOUNTER — Other Ambulatory Visit: Payer: Self-pay

## 2017-11-24 DIAGNOSIS — E119 Type 2 diabetes mellitus without complications: Secondary | ICD-10-CM

## 2017-11-24 NOTE — Telephone Encounter (Signed)
Patient was last seen in February are you able to refill his Lyrica.

## 2017-11-27 MED FILL — TRIAMCINOLONE 0.025% CREAM: 0.025 | 20 days supply | Qty: 80 | Fill #3

## 2017-11-27 MED FILL — metFORMIN HCL 1000 MG TABS: 1000 | 30 days supply | Qty: 60 | Fill #6

## 2017-12-04 ENCOUNTER — Encounter: Payer: Self-pay | Admitting: Family Medicine

## 2017-12-04 ENCOUNTER — Ambulatory Visit (INDEPENDENT_AMBULATORY_CARE_PROVIDER_SITE_OTHER): Payer: Medicaid Other | Admitting: Family Medicine

## 2017-12-04 VITALS — BP 133/64 | HR 81 | Temp 98.3°F | Resp 14 | Ht 69.0 in | Wt 177.0 lb

## 2017-12-04 DIAGNOSIS — G629 Polyneuropathy, unspecified: Secondary | ICD-10-CM

## 2017-12-04 DIAGNOSIS — E119 Type 2 diabetes mellitus without complications: Secondary | ICD-10-CM | POA: Diagnosis not present

## 2017-12-04 LAB — POCT GLYCOSYLATED HEMOGLOBIN (HGB A1C): Hemoglobin A1C: 7.3 % — AB (ref 4.0–5.6)

## 2017-12-04 MED ORDER — PREGABALIN 200 MG PO CAPS: 200.0000 mg | ORAL_CAPSULE | Freq: Three times a day (TID) | ORAL | 1 refills | Status: DC

## 2017-12-04 NOTE — Progress Notes (Signed)
Subjective:    Patient ID: William Ware, male    DOB: Jun 10, 1956, 62 y.o.   MRN: [id number]  HPI William Ware, a 62 year old male with a history of type 2 diabetes and neuropathy presents for follow-up of chronic conditions.  Patient is complaining of worsening neuropathy.  He has been out of Lyrica over the past several weeks.  He is not attempted any other over-the-counter interventions to alleviate current symptoms.  Current pain intensity is characterized as numbness, constant, with periodic sharp pain. Patient also has a history of type 2 diabetes.  He has been taking metformin inconsistently.  He has not been following a carbohydrate modify diet.  Activity level has been decreased due to worsening neuropathy.  Patient denies weight loss, nausea, polyuria, polydipsia, or polyphagia.  Past Medical History:  Diagnosis Date  . Chronic hepatitis C (HCC)   . Depression   . Diabetes mellitus without complication (HCC)   . Diabetic peripheral neuropathy associated with type 2 diabetes mellitus (HCC)    Social History   Socioeconomic History  . Marital status: Widowed    Spouse name: Not on file  . Number of children: Not on file  . Years of education: Not on file  . Highest education level: Not on file  Occupational History  . Not on file  Social Needs  . Financial resource strain: Not on file  . Food insecurity:    Worry: Not on file    Inability: Not on file  . Transportation needs:    Medical: Not on file    Non-medical: Not on file  Tobacco Use  . Smoking status: Former Smoker    Types: Cigarettes    Start date: 08/18/1970    Last attempt to quit: 10/17/2015    Years since quitting: 2.1  . Smokeless tobacco: Never Used  . Tobacco comment: 1-2 per day  Substance and Sexual Activity  . Alcohol use: No    Alcohol/week: 0.0 oz    Comment: none since 2016  . Drug use: No    Types: Cocaine    Comment: none since 2016  . Sexual activity: Not on file  Lifestyle  .  Physical activity:    Days per week: Not on file    Minutes per session: Not on file  . Stress: Not on file  Relationships  . Social connections:    Talks on phone: Not on file    Gets together: Not on file    Attends religious service: Not on file    Active member of club or organization: Not on file    Attends meetings of clubs or organizations: Not on file    Relationship status: Not on file  . Intimate partner violence:    Fear of current or ex partner: Not on file    Emotionally abused: Not on file    Physically abused: Not on file    Forced sexual activity: Not on file  Other Topics Concern  . Not on file  Social History Narrative  . Not on file   Review of Systems  Constitutional: Negative.   HENT: Negative.   Eyes: Negative.   Respiratory: Negative.   Cardiovascular: Negative.   Gastrointestinal: Negative.   Endocrine: Negative.   Musculoskeletal: Negative.        Bilateral foot pain  Neurological: Positive for numbness.  Hematological: Negative.   Psychiatric/Behavioral: Negative.        Objective:   Physical Exam  Constitutional: He appears well-developed and  well-nourished.  HENT:  Head: Normocephalic.  Eyes: Pupils are equal, round, and reactive to light.  Neck: Normal range of motion.  Cardiovascular: Normal rate, regular rhythm, normal heart sounds and intact distal pulses.  Pulmonary/Chest: Effort normal and breath sounds normal.  Abdominal: Soft. Bowel sounds are normal.  Musculoskeletal: Normal range of motion.       Right foot: There is normal range of motion and no deformity.       Left foot: There is normal range of motion and no deformity.  Feet:  Right Foot:  Protective Sensation: 5 sites tested.  Skin Integrity: Negative for erythema or warmth.  Left Foot:  Protective Sensation: 5 sites tested.  Skin Integrity: Negative for erythema or warmth.  Neurological: He is alert. He displays normal reflexes.  Skin: Skin is warm and dry.   Psychiatric: He has a normal mood and affect. His behavior is normal. Judgment and thought content normal.         BP 133/64 (BP Location: Left Arm, Patient Position: Sitting, Cuff Size: Normal)   Pulse 81   Temp 98.3 F (36.8 C) (Oral)   Resp 14   Ht  (1.753 m)   Wt 177 lb (80.3 kg)   SpO2 100%   BMI 26.14 kg/m  Assessment & Plan:    Type 2 diabetes mellitus without complication, without long-term current use of insulin (HCC) - pregabalin (LYRICA) 200 MG capsule; Take 1 capsule (200 mg total) by mouth 3 (three) times daily.  Dispense: 270 capsule; Refill: 1 - HgB A1c - Comprehensive metabolic panel  Neuropathy  - pregabalin (LYRICA) 200 MG capsule; Take 1 capsule (200 mg total) by mouth 3 (three) times daily.  Dispense: 270 capsule; Refill: 1   RTC: 3 months for chronic conditions   Nolon Nations  MSN, FNP-C [facility] [address] [place], [place] [address] [phone]

## 2017-12-05 ENCOUNTER — Telehealth: Payer: Self-pay

## 2017-12-05 DIAGNOSIS — G629 Polyneuropathy, unspecified: Secondary | ICD-10-CM | POA: Insufficient documentation

## 2017-12-05 DIAGNOSIS — E119 Type 2 diabetes mellitus without complications: Secondary | ICD-10-CM | POA: Insufficient documentation

## 2017-12-05 LAB — COMPREHENSIVE METABOLIC PANEL
A/G RATIO: 1.3 (ref 1.2–2.2)
ALT: 17 IU/L (ref 0–44)
AST: 21 IU/L (ref 0–40)
Albumin: 4.5 g/dL (ref 3.6–4.8)
Alkaline Phosphatase: 61 IU/L (ref 39–117)
BUN/Creatinine Ratio: 14 (ref 10–24)
BUN: 19 mg/dL (ref 8–27)
Bilirubin Total: 0.5 mg/dL (ref 0.0–1.2)
CO2: 21 mmol/L (ref 20–29)
Calcium: 9.8 mg/dL (ref 8.6–10.2)
Chloride: 96 mmol/L (ref 96–106)
Creatinine, Ser: 1.33 mg/dL — ABNORMAL HIGH (ref 0.76–1.27)
GFR calc Af Amer: 67 mL/min/{1.73_m2} (ref >59)
GFR calc non Af Amer: 58 mL/min/{1.73_m2} — ABNORMAL LOW (ref >59)
Globulin, Total: 3.4 g/dL (ref 1.5–4.5)
Glucose: 195 mg/dL — ABNORMAL HIGH (ref 65–99)
POTASSIUM: 4.9 mmol/L (ref 3.5–5.2)
Sodium: 134 mmol/L (ref 134–144)
Total Protein: 7.9 g/dL (ref 6.0–8.5)

## 2017-12-05 NOTE — Patient Instructions (Signed)

## 2017-12-05 NOTE — Telephone Encounter (Signed)
Called and spoke with patient, advised that creatinine level is elevated and asked that patient start a low fat/ low carb diet. Asked that patient keep follow up as scheduled. Thanks!

## 2017-12-05 NOTE — Telephone Encounter (Signed)
-----   Message from Massie Maroon, [place] sent at 12/05/2017 11:07 AM EDT ----- Regarding: lab results Please inform patient that creatinine level ( an indicator of kidney function) is mildly elevated. Discuss the importance of starting a low fat, carbohydrate modified diet. Also, advise to follow up in clinic as scheduled.   Nolon Nations  MSN, FNP-C [facility] [address] [place], [place] [address] [phone]

## 2017-12-27 ENCOUNTER — Ambulatory Visit: Payer: Medicaid Other | Admitting: Family Medicine

## 2017-12-29 ENCOUNTER — Ambulatory Visit (INDEPENDENT_AMBULATORY_CARE_PROVIDER_SITE_OTHER): Payer: Medicaid Other | Admitting: Family Medicine

## 2017-12-29 ENCOUNTER — Encounter: Payer: Self-pay | Admitting: Family Medicine

## 2017-12-29 VITALS — BP 126/82 | HR 80 | Temp 98.0°F | Ht 69.0 in | Wt 180.0 lb

## 2017-12-29 DIAGNOSIS — M79671 Pain in right foot: Secondary | ICD-10-CM | POA: Diagnosis not present

## 2017-12-29 DIAGNOSIS — G629 Polyneuropathy, unspecified: Secondary | ICD-10-CM

## 2017-12-29 DIAGNOSIS — E1141 Type 2 diabetes mellitus with diabetic mononeuropathy: Secondary | ICD-10-CM | POA: Diagnosis not present

## 2017-12-29 DIAGNOSIS — F418 Other specified anxiety disorders: Secondary | ICD-10-CM

## 2017-12-29 DIAGNOSIS — M79672 Pain in left foot: Secondary | ICD-10-CM | POA: Diagnosis not present

## 2017-12-29 DIAGNOSIS — IMO0001 Reserved for inherently not codable concepts without codable children: Secondary | ICD-10-CM

## 2017-12-29 DIAGNOSIS — Z09 Encounter for follow-up examination after completed treatment for conditions other than malignant neoplasm: Secondary | ICD-10-CM | POA: Diagnosis not present

## 2017-12-29 DIAGNOSIS — E1165 Type 2 diabetes mellitus with hyperglycemia: Secondary | ICD-10-CM | POA: Diagnosis not present

## 2017-12-29 DIAGNOSIS — E119 Type 2 diabetes mellitus without complications: Secondary | ICD-10-CM | POA: Diagnosis not present

## 2017-12-29 LAB — POCT URINALYSIS DIP (MANUAL ENTRY)
Bilirubin, UA: NEGATIVE
Glucose, UA: NEGATIVE mg/dL
Ketones, POC UA: NEGATIVE mg/dL
Leukocytes, UA: NEGATIVE
Nitrite, UA: NEGATIVE
Protein Ur, POC: 100 mg/dL — AB
Spec Grav, UA: 1.025 (ref 1.010–1.025)
Urobilinogen, UA: 0.2 E.U./dL
pH, UA: 6.5 (ref 5.0–8.0)

## 2017-12-29 LAB — POCT GLYCOSYLATED HEMOGLOBIN (HGB A1C): Hemoglobin A1C: 7.1 % — AB (ref 4.0–5.6)

## 2017-12-29 MED ORDER — PREGABALIN 200 MG PO CAPS: 200.0000 mg | ORAL_CAPSULE | Freq: Three times a day (TID) | ORAL | 3 refills | Status: AC

## 2017-12-29 NOTE — Progress Notes (Signed)
Subjective:    Patient ID: William Ware, male    DOB: August 23, 1955, 62 y.o.   MRN: [id number]   PCP: Raliegh Ip, NP  Chief Complaint  Patient presents with  . Follow-up    6 month on chronic condition   HPI  William Ware has a history of Diabetes, Chronic Hepatitis C, Neuropathy, and Depression.   Current Status: Today he is doing well with no complaints. He denies  increased thirst, frequent urination, hunger, fatigue, blurred vision, excessive hunger, excessive thirst, weight gain, weight loss, and poor wound healing. any recent incidents of infection. Denies recent infections, headaches, visual changes, dizziness and falls.   Denies cough, shortness of breath, chest pain, and heart palpitations.   He denies GI symptoms.   He continues to have mild anxiety and depression, which he take Lyric for symptoms.   He continues to have chronic pain in his feet, r/t diabetic neuropathy.   Past Medical History:  Diagnosis Date  . Chronic hepatitis C (HCC)   . Depression   . Diabetes mellitus without complication (HCC)   . Diabetic peripheral neuropathy associated with type 2 diabetes mellitus (HCC)     Family History  Problem Relation Age of Onset  . Diabetes Neg Hx   . Stroke Neg Hx   . Hypertension Neg Hx     Social History   Socioeconomic History  . Marital status: Widowed    Spouse name: Not on file  . Number of children: Not on file  . Years of education: Not on file  . Highest education level: Not on file  Occupational History  . Not on file  Social Needs  . Financial resource strain: Not on file  . Food insecurity:    Worry: Not on file    Inability: Not on file  . Transportation needs:    Medical: Not on file    Non-medical: Not on file  Tobacco Use  . Smoking status: Former Smoker    Types: Cigarettes    Start date: 08/18/1970    Last attempt to quit: 10/17/2015    Years since quitting: 2.2  . Smokeless tobacco: Never Used  . Tobacco comment: 1-2  per day  Substance and Sexual Activity  . Alcohol use: No    Alcohol/week: 0.0 oz    Comment: none since 2016  . Drug use: No    Types: Cocaine    Comment: none since 2016  . Sexual activity: Not on file  Lifestyle  . Physical activity:    Days per week: Not on file    Minutes per session: Not on file  . Stress: Not on file  Relationships  . Social connections:    Talks on phone: Not on file    Gets together: Not on file    Attends religious service: Not on file    Active member of club or organization: Not on file    Attends meetings of clubs or organizations: Not on file    Relationship status: Not on file  . Intimate partner violence:    Fear of current or ex partner: Not on file    Emotionally abused: Not on file    Physically abused: Not on file    Forced sexual activity: Not on file  Other Topics Concern  . Not on file  Social History Narrative  . Not on file    History reviewed. No pertinent surgical history.   Immunization History  Administered Date(s) Administered  . Hepatitis  B, adult 05/24/2017, 07/03/2017, 11/14/2017  . Influenza,inj,Quad PF,6+ Mos 09/02/2015  . Pneumococcal Polysaccharide-23 05/24/2017    Current Meds  Medication Sig  . DULoxetine (CYMBALTA) 60 MG capsule Take 1 capsule (60 mg total) by mouth daily.  . Fluocinonide Emulsified Base 0.05 % CREA APPLY 1 APPLICATION TOPICALLY 2 (TWO) TIMES DAILY. FOR ECZEMA  . metFORMIN (GLUCOPHAGE) 1000 MG tablet Take 1 tablet (1,000 mg total) by mouth 2 (two) times daily with a meal.  . pregabalin (LYRICA) 200 MG capsule Take 1 capsule (200 mg total) by mouth 3 (three) times daily.  Marland Kitchen. triamcinolone (KENALOG) 0.025 % cream Apply 1 application topically 2 (two) times daily.  Marland Kitchen. XTAMPZA ER 9 MG C12A Take 9 mg by mouth 2 (two) times daily.  . [DISCONTINUED] fluocinonide-emollient (LIDEX-E) 0.05 % cream Apply 1 application topically 2 (two) times daily. For eczema  . [DISCONTINUED] pregabalin (LYRICA) 200 MG  capsule Take 1 capsule (200 mg total) by mouth 3 (three) times daily.    Allergies  Allergen Reactions  . Lisinopril     No allergy  Only intolerance. Medication produces dizziness    BP 126/82 (BP Location: Left Arm, Patient Position: Sitting, Cuff Size: Small)   Pulse 80   Temp 98 F (36.7 C) (Oral)   Ht 5\' 9"  (1.753 m)   Wt 180 lb (81.6 kg)   SpO2 100%   BMI 26.58 kg/m    Review of Systems  Constitutional: Negative.   HENT: Negative.   Eyes: Negative.   Respiratory: Negative.   Cardiovascular: Negative.   Gastrointestinal: Negative.   Endocrine: Negative.   Genitourinary: Negative.   Musculoskeletal: Negative.   Skin: Negative.   Neurological: Negative.   Hematological: Negative.   Psychiatric/Behavioral: Negative.    Objective:   Physical Exam  Constitutional: He is oriented to person, place, and time. He appears well-developed and well-nourished.  HENT:  Head: Normocephalic and atraumatic.  Right Ear: External ear normal.  Left Ear: External ear normal.  Nose: Nose normal.  Mouth/Throat: Oropharynx is clear and moist.  Eyes: Pupils are equal, round, and reactive to light. Conjunctivae and EOM are normal.  Neck: Normal range of motion. Neck supple.  Cardiovascular: Normal rate, regular rhythm, normal heart sounds and intact distal pulses.  Pulmonary/Chest: Breath sounds normal.  Abdominal: Soft. Bowel sounds are normal.  Musculoskeletal: Normal range of motion.  Neurological: He is alert and oriented to person, place, and time.  Skin: Skin is warm and dry. Capillary refill takes less than 2 seconds.  Psychiatric: He has a normal mood and affect. His behavior is normal. Judgment and thought content normal.  Nursing note and vitals reviewed.  Assessment & Plan:   1. Type 2 diabetes mellitus without complication, without long-term current use of insulin (HCC) b A1c is improving to today, decreased from 7.3 on 12/04/2017. He will continue Metformin as  prescribed.   - POCT glycosylated hemoglobin (Hb A1C) - POCT urinalysis dipstick  2. Foot pain, bilateral He will continue Cymbalta as prescribed.   3. Diabetic mononeuropathy associated with type 2 diabetes mellitus (HCC) Stable. Not worsening. He will continue Cymbalta as directed for pain in feet.   4. Depression with anxiety Stable. He will continue Lyrica as prescribed.  - pregabalin (LYRICA) 200 MG capsule; Take 1 capsule (200 mg total) by mouth 3 (three) times daily.  Dispense: 90 capsule; Refill: 3  5. Follow up Follow up in 3 months.  Meds ordered this encounter  Medications  . pregabalin (LYRICA) 200  MG capsule    Sig: Take 1 capsule (200 mg total) by mouth 3 (three) times daily.    Dispense:  90 capsule    Refill:  3     Raliegh Ip,  MSN, FNP-BC Patient [facility] [address] [place], [place] [address] [phone]

## 2018-01-03 MED FILL — metFORMIN HCL 1000 MG TABS: 1000 | 30 days supply | Qty: 60 | Fill #7

## 2018-01-03 MED FILL — TRIAMCINOLONE 0.025% CREAM: 0.025 | 20 days supply | Qty: 80 | Fill #4

## 2018-01-15 ENCOUNTER — Encounter: Payer: Self-pay | Admitting: Family Medicine

## 2018-01-15 ENCOUNTER — Ambulatory Visit (INDEPENDENT_AMBULATORY_CARE_PROVIDER_SITE_OTHER): Payer: Medicaid Other | Admitting: Family Medicine

## 2018-01-15 VITALS — BP 122/78 | HR 88 | Temp 97.8°F | Ht 69.0 in | Wt 177.0 lb

## 2018-01-15 DIAGNOSIS — L309 Dermatitis, unspecified: Secondary | ICD-10-CM

## 2018-01-15 DIAGNOSIS — Z09 Encounter for follow-up examination after completed treatment for conditions other than malignant neoplasm: Secondary | ICD-10-CM | POA: Diagnosis not present

## 2018-01-15 MED ORDER — METHYLPREDNISOLONE SODIUM SUCC 125 MG IJ SOLR: 125.0000 mg | Freq: Once | INTRAMUSCULAR | Status: DC

## 2018-01-15 MED ORDER — PREDNISONE 20 MG PO TABS: ORAL_TABLET | ORAL | 0 refills | Status: AC

## 2018-01-15 MED FILL — predniSONE 20 MG TABS: 20 | 9 days supply | Qty: 18 | Fill #0

## 2018-01-15 NOTE — Progress Notes (Signed)
Subjective:    Patient ID: William Ware, male    DOB: [birth year], 62 y.o.   MRN: [id number]   PCP: Raliegh IpNatalie Zania Kalisz, NP  Chief Complaint  Patient presents with  . Rash    hands,arms,back    HPI  William Ware has a past medical history of Chronic Dermatitis Diabetes, Neuropathy, Depression, and Chronic Hepatitis C.  Current Status: Since his last office visit, he has developed an acute flared up of worsening dermatitis. It is located on his arms, back, abdomen, and chest. He states that it itches badly. He as been followed by Dermatology in the past. Otherwise, he is doing well with no other complaints.   He denies fevers, chills, fatigue, recent infections, weight loss, and night sweats.   He has not had any headaches, visual changes, dizziness, and falls.   No chest pain, heart palpitations, cough and shortness of breath reported.  No reports of GI problems such as nausea, vomiting, diarrhea, and constipation. He has no reports of blood in stools, dysuria and hematuria.   No depression or anxiety, and denies suicidal ideations, homicidal ideations, or auditory hallucinations.   He denies pain today.   Past Medical History:  Diagnosis Date  . Chronic hepatitis C (HCC)   . Depression   . Diabetes mellitus without complication (HCC)   . Diabetic peripheral neuropathy associated with type 2 diabetes mellitus (HCC)     Family History  Problem Relation Age of Onset  . Diabetes Neg Hx   . Stroke Neg Hx   . Hypertension Neg Hx     Social History   Socioeconomic History  . Marital status: Widowed    Spouse name: Not on file  . Number of children: Not on file  . Years of education: Not on file  . Highest education level: Not on file  Occupational History  . Not on file  Social Needs  . Financial resource strain: Not on file  . Food insecurity:    Worry: Not on file    Inability: Not on file  . Transportation needs:    Medical: Not on file    Non-medical: Not on  file  Tobacco Use  . Smoking status: Former Smoker    Types: Cigarettes    Start date: 08/18/1970    Last attempt to quit: 10/17/2015    Years since quitting: 2.2  . Smokeless tobacco: Never Used  . Tobacco comment: 1-2 per day  Substance and Sexual Activity  . Alcohol use: No    Alcohol/week: 0.0 oz    Comment: none since 2016  . Drug use: No    Types: Cocaine    Comment: none since 2016  . Sexual activity: Not on file  Lifestyle  . Physical activity:    Days per week: Not on file    Minutes per session: Not on file  . Stress: Not on file  Relationships  . Social connections:    Talks on phone: Not on file    Gets together: Not on file    Attends religious service: Not on file    Active member of club or organization: Not on file    Attends meetings of clubs or organizations: Not on file    Relationship status: Not on file  . Intimate partner violence:    Fear of current or ex partner: Not on file    Emotionally abused: Not on file    Physically abused: Not on file    Forced sexual activity:  Not on file  Other Topics Concern  . Not on file  Social History Narrative  . Not on file    History reviewed. No pertinent surgical history.   Immunization History  Administered Date(s) Administered  . Hepatitis B, adult 05/24/2017, 07/03/2017, 11/14/2017  . Influenza,inj,Quad PF,6+ Mos 09/02/2015  . Pneumococcal Polysaccharide-23 05/24/2017    Current Meds  Medication Sig  . DULoxetine (CYMBALTA) 60 MG capsule Take 1 capsule (60 mg total) by mouth daily.  . metFORMIN (GLUCOPHAGE) 1000 MG tablet Take 1 tablet (1,000 mg total) by mouth 2 (two) times daily with a meal.  . pregabalin (LYRICA) 200 MG capsule Take 1 capsule (200 mg total) by mouth 3 (three) times daily.  Marland Kitchen triamcinolone (KENALOG) 0.025 % cream Apply 1 application topically 2 (two) times daily.  Marland Kitchen XTAMPZA ER 13.5 MG C12A Take 13.5 mg by mouth.  . [DISCONTINUED] Fluocinonide Emulsified Base 0.05 % CREA APPLY 1  APPLICATION TOPICALLY 2 (TWO) TIMES DAILY. FOR ECZEMA   Allergies  Allergen Reactions  . Lisinopril     No allergy  Only intolerance. Medication produces dizziness    BP 122/78 (BP Location: Right Arm, Patient Position: Sitting, Cuff Size: Small)   Pulse 88   Temp 97.8 F (36.6 C) (Oral)   Ht 5\' 9"  (1.753 m)   Wt 177 lb (80.3 kg)   SpO2 98%   BMI 26.14 kg/m   Review of Systems  Constitutional: Negative.   HENT: Negative.   Eyes: Negative.   Respiratory: Negative.   Cardiovascular: Negative.   Gastrointestinal: Negative.   Endocrine: Negative.   Genitourinary: Negative.   Musculoskeletal: Negative.   Skin: Negative.   Allergic/Immunologic: Negative.   Neurological: Negative.   Hematological: Negative.   Psychiatric/Behavioral: Negative.    Objective:   Physical Exam  Constitutional: He is oriented to person, place, and time. He appears well-developed and well-nourished.  HENT:  Head: Normocephalic and atraumatic.  Right Ear: External ear normal.  Left Ear: External ear normal.  Nose: Nose normal.  Mouth/Throat: Oropharynx is clear and moist.  Eyes: Pupils are equal, round, and reactive to light. Conjunctivae and EOM are normal.  Neck: Normal range of motion. Neck supple.  Cardiovascular: Normal rate, regular rhythm, normal heart sounds and intact distal pulses.  Pulmonary/Chest: Effort normal and breath sounds normal.  Abdominal: Soft.  Musculoskeletal: Normal range of motion.  Neurological: He is alert and oriented to person, place, and time.  Skin: Skin is warm and dry. Capillary refill takes less than 2 seconds. Rash (thick, inflammed on arms, back, abdomen.) noted. There is erythema.  Psychiatric: He has a normal mood and affect. His behavior is normal. Judgment and thought content normal.   Assessment & Plan:   1. Chronic dermatitis We will initiate him on Prednisone Taper today. He will continue Triamcinolone Cream as prescribed. He was previously referred  to Dermatology 08/2017. We will refer him back to Dermatology  today.  - predniSONE (DELTASONE) 20 MG tablet; Take 3 tablets X 3 days, Take 2 tablets X 3 days, Take 1 tablet X 3 days, then complete.  Dispense: 18 tablet; Refill: 0  2. Follow up He will follow up in 2 weeks for re-assessment.   Meds ordered this encounter  Medications  . DISCONTD: methylPREDNISolone sodium succinate (SOLU-MEDROL) 125 mg/2 mL injection 125 mg  . predniSONE (DELTASONE) 20 MG tablet    Sig: Take 3 tablets X 3 days, Take 2 tablets X 3 days, Take 1 tablet X 3 days, then  complete.    Dispense:  18 tablet    Refill:  0    Referral Orders     Ambulatory referral to Dermatology  Raliegh Ip,  MSN, FNP-BC Patient Care [facility] [address] [place], [place] [address] [phone]

## 2018-01-22 ENCOUNTER — Telehealth: Payer: Self-pay

## 2018-01-22 NOTE — Telephone Encounter (Signed)
-----   Message from Kallie LocksNatalie M Stroud, FNP sent at 01/15/2018 12:23 PM EDT ----- Regarding: "Dermatology" William Ware,   Please contact patient and let him know that we are referring him to Dermatology today.   Remind him that it is very important for him not to follow up with dermatologist. [name] referred him 08/2017 and he did not go to appointment.   He has been seen by Dermatology in the past.   Thank you.

## 2018-01-22 NOTE — Telephone Encounter (Signed)
Patient notified

## 2018-01-29 ENCOUNTER — Ambulatory Visit: Payer: Medicaid Other | Admitting: Family Medicine

## 2018-02-15 DEATH — deceased

## 2018-04-02 ENCOUNTER — Ambulatory Visit: Payer: Medicaid Other | Admitting: Family Medicine

## 2018-11-13 NOTE — Telephone Encounter (Signed)
Message sent to provider
